# Patient Record
Sex: Female | Born: 1970
Health system: Southern US, Community
[De-identification: ages and names within clinical notes are randomized; demographics above are authoritative.]

## PROBLEM LIST (undated history)

## (undated) DIAGNOSIS — H905 Unspecified sensorineural hearing loss: Secondary | ICD-10-CM

## (undated) DIAGNOSIS — N951 Menopausal and female climacteric states: Secondary | ICD-10-CM

## (undated) DIAGNOSIS — E782 Mixed hyperlipidemia: Secondary | ICD-10-CM

## (undated) DIAGNOSIS — I10 Essential (primary) hypertension: Secondary | ICD-10-CM

## (undated) DIAGNOSIS — I509 Heart failure, unspecified: Secondary | ICD-10-CM

## (undated) DIAGNOSIS — I2510 Atherosclerotic heart disease of native coronary artery without angina pectoris: Secondary | ICD-10-CM

## (undated) DIAGNOSIS — Z8673 Personal history of transient ischemic attack (TIA), and cerebral infarction without residual deficits: Secondary | ICD-10-CM

## (undated) DIAGNOSIS — I429 Cardiomyopathy, unspecified: Secondary | ICD-10-CM

## (undated) DIAGNOSIS — I639 Cerebral infarction, unspecified: Secondary | ICD-10-CM

## (undated) HISTORY — DX: Menopausal and female climacteric states: N95.1

## (undated) HISTORY — DX: Heart failure, unspecified: I50.9

## (undated) HISTORY — DX: Unspecified sensorineural hearing loss: H90.5

## (undated) HISTORY — DX: Atherosclerotic heart disease of native coronary artery without angina pectoris: I25.10

## (undated) HISTORY — PX: TUBAL LIGATION: SHX77

## (undated) HISTORY — PX: ATRIAL CARDIAC PACEMAKER INSERTION: SHX561

## (undated) HISTORY — DX: Personal history of transient ischemic attack (TIA), and cerebral infarction without residual deficits: Z86.73

## (undated) HISTORY — DX: Cardiomyopathy, unspecified: I42.9

## (undated) HISTORY — DX: Mixed hyperlipidemia: E78.2

## (undated) HISTORY — DX: Essential (primary) hypertension: I10

---

## 2017-09-20 DIAGNOSIS — Z8673 Personal history of transient ischemic attack (TIA), and cerebral infarction without residual deficits: Secondary | ICD-10-CM | POA: Insufficient documentation

## 2017-09-20 DIAGNOSIS — I10 Essential (primary) hypertension: Secondary | ICD-10-CM | POA: Diagnosis present

## 2018-01-05 ENCOUNTER — Encounter (HOSPITAL_COMMUNITY): Payer: Self-pay | Admitting: Emergency Medicine

## 2018-01-05 ENCOUNTER — Emergency Department (HOSPITAL_COMMUNITY)
Admission: EM | Admit: 2018-01-05 | Discharge: 2018-01-05 | Disposition: A | Discharge status: Home / Self Care | Payer: Medicare Other | Attending: Emergency Medicine | Admitting: Emergency Medicine

## 2018-01-05 ENCOUNTER — Other Ambulatory Visit: Payer: Self-pay

## 2018-01-05 DIAGNOSIS — I10 Essential (primary) hypertension: Secondary | ICD-10-CM | POA: Insufficient documentation

## 2018-01-05 DIAGNOSIS — F17210 Nicotine dependence, cigarettes, uncomplicated: Secondary | ICD-10-CM | POA: Insufficient documentation

## 2018-01-05 DIAGNOSIS — M79602 Pain in left arm: Secondary | ICD-10-CM | POA: Diagnosis present

## 2018-01-05 HISTORY — DX: Cerebral infarction, unspecified: I63.9

## 2018-01-05 LAB — I-STAT TROPONIN, ED: TROPONIN I, POC: 0 ng/mL (ref 0.00–0.08)

## 2018-01-05 LAB — COMPREHENSIVE METABOLIC PANEL
ALK PHOS: 56 U/L (ref 38–126)
ALT: 12 U/L (ref 0–44)
AST: 21 U/L (ref 15–41)
Albumin: 3.7 g/dL (ref 3.5–5.0)
Anion gap: 9 (ref 5–15)
BILIRUBIN TOTAL: 0.5 mg/dL (ref 0.3–1.2)
BUN: 13 mg/dL (ref 6–20)
CALCIUM: 9.2 mg/dL (ref 8.9–10.3)
CO2: 25 mmol/L (ref 22–32)
CREATININE: 0.9 mg/dL (ref 0.44–1.00)
Chloride: 106 mmol/L (ref 98–111)
Glucose, Bld: 108 mg/dL — ABNORMAL HIGH (ref 70–99)
Potassium: 4.7 mmol/L (ref 3.5–5.1)
Sodium: 140 mmol/L (ref 135–145)
Total Protein: 6.9 g/dL (ref 6.5–8.1)

## 2018-01-05 LAB — CBC WITH DIFFERENTIAL/PLATELET
BASOS PCT: 4 %
Basophils Absolute: 0.7 10*3/uL — ABNORMAL HIGH (ref 0.0–0.1)
Eosinophils Absolute: 0.4 10*3/uL (ref 0.0–0.7)
Eosinophils Relative: 2 %
HCT: 40.9 % (ref 36.0–46.0)
HEMOGLOBIN: 12.1 g/dL (ref 12.0–15.0)
LYMPHS PCT: 19 %
Lymphs Abs: 3.3 10*3/uL (ref 0.7–4.0)
MCH: 23.6 pg — ABNORMAL LOW (ref 26.0–34.0)
MCHC: 29.6 g/dL — ABNORMAL LOW (ref 30.0–36.0)
MCV: 79.7 fL (ref 78.0–100.0)
MONOS PCT: 4 %
Monocytes Absolute: 0.7 10*3/uL (ref 0.1–1.0)
NEUTROS ABS: 12.5 10*3/uL — AB (ref 1.7–7.7)
Neutrophils Relative %: 71 %
Platelets: 356 10*3/uL (ref 150–400)
RBC: 5.13 MIL/uL — ABNORMAL HIGH (ref 3.87–5.11)
RDW: 23.3 % — ABNORMAL HIGH (ref 11.5–15.5)
WBC: 17.6 10*3/uL — ABNORMAL HIGH (ref 4.0–10.5)

## 2018-01-05 MED ORDER — PREDNISONE 20 MG PO TABS
60.0000 mg | ORAL_TABLET | Freq: Once | ORAL | Status: AC
  Administered 2018-01-05: 60 mg via ORAL
  Filled 2018-01-05: qty 3

## 2018-01-05 MED ORDER — HYDROCODONE-ACETAMINOPHEN 5-325 MG PO TABS
1.0000 | ORAL_TABLET | Freq: Once | ORAL | Status: AC
  Administered 2018-01-05: 1 via ORAL
  Filled 2018-01-05: qty 1

## 2018-01-05 MED ORDER — HYDROCODONE-ACETAMINOPHEN 5-325 MG PO TABS: 1.0000 | ORAL_TABLET | ORAL | 0 refills | Status: DC | PRN

## 2018-01-05 MED ORDER — PREDNISONE 20 MG PO TABS: 20.0000 mg | ORAL_TABLET | Freq: Two times a day (BID) | ORAL | 0 refills | Status: DC

## 2018-01-05 NOTE — Discharge Instructions (Signed)
There were no serious problems found to explain your discomfort.  Use heat on the sore area 3 or 4 times a day.  Wear the sling as needed for comfort.  Follow-up with your primary care doctor for checkup in 1 week if not better.  Take your blood pressure medicine as prescribed.

## 2018-01-05 NOTE — ED Provider Notes (Signed)
Patient placed in Quick Look pathway, seen and evaluated   Chief Complaint: left arm pain  HPI:   Charlotte Wyatt is a 47 y.o. female with hx of HTN, CVA 2013 who presents to the ED with left arm pain and tingling. Patient noted the symptoms when she woke up after sleeping on the floor and laying on the left arm. Patient denies chest pain, shortness of breath or n/v.   ROS: M/S: left arm pain  Physical Exam:  BP (!) 207/116   Pulse 78   Temp 98.4 F (36.9 C)   Resp 18   SpO2 100%    Gen: No distress  Neuro: Awake and Alert  Skin: Warm and dry  M/S: full passive range of motion of the left arm with minimal pain. Radial pulse 2+,     Initiation of care has begun. The patient has been counseled on the process, plan, and necessity for staying for the completion/evaluation, and the remainder of the medical screening examination    Ashley Murrain, NP 01/05/18 1741    Hayden Rasmussen, MD 01/06/18 [phone]

## 2018-01-05 NOTE — ED Provider Notes (Signed)
[facility] EMERGENCY DEPARTMENT Provider Note   CSN: [id number] Arrival date & time: 01/05/18  1724     History   Chief Complaint Chief Complaint  Patient presents with  . Arm Pain    HPI Charlotte Wyatt is a 47 y.o. female.  HPI   She presents for evaluation of left arm pain with a tingling sensation.  She noticed it tonight after sleeping on the floor and lying on her left arm.  Prior similar problems.  She denies headache, neck pain, chest pain or back pain.  She did not take her evening carvedilol yet.  There are no other known modifying factors.  Past Medical History:  Diagnosis Date  . Hypertension   . Stroke [facility])     There are no active problems to display for this patient.   History reviewed. No pertinent surgical history.   OB History   None      Home Medications    Prior to Admission medications   Not on File    Family History No family history on file.  Social History Social History   Tobacco Use  . Smoking status: Current Every Day Smoker    Packs/day: 0.50  . Smokeless tobacco: Never Used  Substance Use Topics  . Alcohol use: Yes    Comment: occasionally  . Drug use: Never     Allergies   Patient has no known allergies.   Review of Systems Review of Systems  All other systems reviewed and are negative.    Physical Exam Updated Vital Signs BP (!) 225/130 (BP Location: Right Arm)   Pulse 78   Temp 99 F (37.2 C) (Oral)   Resp 16   Ht 5\' 2"  (1.575 m)   Wt 86.2 kg   SpO2 99%   BMI 34.75 kg/m   Physical Exam  Constitutional: She is oriented to person, place, and time. She appears well-developed and well-nourished.  HENT:  Head: Normocephalic and atraumatic.  Eyes: Pupils are equal, round, and reactive to light. Conjunctivae and EOM are normal.  Neck: Normal range of motion and phonation normal. Neck supple.  Cardiovascular: Normal rate and regular rhythm.  Pulmonary/Chest: Effort normal and breath  sounds normal. She exhibits no tenderness.  Musculoskeletal: Normal range of motion.  Normal range of motion arms bilaterally.  No deformity of the left arm.  Neurological: She is alert and oriented to person, place, and time. She exhibits normal muscle tone.  Skin: Skin is warm and dry.  Psychiatric: She has a normal mood and affect. Her behavior is normal. Judgment and thought content normal.  Nursing note and vitals reviewed.    ED Treatments / Results  Labs (all labs ordered are listed, but only abnormal results are displayed) Labs Reviewed  CBC WITH DIFFERENTIAL/PLATELET - Abnormal; Notable for the following components:      Result Value   WBC 17.6 (*)    RBC 5.13 (*)    MCH 23.6 (*)    MCHC 29.6 (*)    RDW 23.3 (*)    Neutro Abs 12.5 (*)    Basophils Absolute 0.7 (*)    All other components within normal limits  COMPREHENSIVE METABOLIC PANEL - Abnormal; Notable for the following components:   Glucose, Bld 108 (*)    All other components within normal limits  I-STAT TROPONIN, ED    EKG EKG Interpretation  Date/Time:  Thursday January 05 2018 17:46:30 EDT Ventricular Rate:  75 PR Interval:  162 QRS Duration:  90 QT Interval:  414 QTC Calculation: 462 R Axis:   -19 Text Interpretation:  Normal sinus rhythm ST & T wave abnormality, consider inferolateral ischemia Prolonged QT Abnormal ECG No previous ECGs available Confirmed by Daleen Bo [phone]) on 01/05/2018 10:26:16 PM   Radiology No results found.  Procedures Procedures (including critical care time)  Medications Ordered in ED Medications - No data to display   Initial Impression / Assessment and Plan / ED Course  I have reviewed the triage vital signs and the nursing notes.  Pertinent labs & imaging results that were available during my care of the patient were reviewed by me and considered in my medical decision making (see chart for details).  Clinical Course as of Jan 06 2228  Thu Jan 05, 2018    2226 Comment 3:        [EW]  2226 Comment 3:        [EW]  2226 I-Stat Troponin, ED (not at [facility]) [EW]  2227 Normal  I-Stat Troponin, ED (not at [facility]) [EW]  2227 Normal except white count high, and abnormal red cell indices.  CBC with Differential(!) [EW]  2228 Normal except glucose high  Comprehensive metabolic panel(!) [EW]  7482 Abnormal, high  BP(!): 225/130 [EW]    Clinical Course User Index [EW] Daleen Bo, MD     Patient Vitals for the past 24 hrs:  BP Temp Temp src Pulse Resp SpO2 Height Weight  01/05/18 2314 (!) 211/121 98.5 F (36.9 C) Oral 79 16 97 % - -  01/05/18 2200 (!) 191/113 - - 82 16 99 % - -  01/05/18 2124 - - - - - - 5\' 2"  (1.575 m) 86.2 kg  01/05/18 1955 (!) 225/130 99 F (37.2 C) Oral 78 16 99 % - -  01/05/18 1735 (!) 207/116 98.4 F (36.9 C) - 78 18 100 % - -    At discharge- reevaluation with update and discussion. After initial assessment and treatment, an updated evaluation reveals she is comfortable has no further complaints.Daleen Bo   Medical Decision Making: Reported pain and numbness left arm without significant physical findings.  Doubt CVA, cervical myelopathy or brachial plexopathy.  Also doubt fracture or cellulitis.  No indication for further ED work-up.  CRITICAL CARE-no Performed by: Daleen Bo  Nursing Notes Reviewed/ Care Coordinated Applicable Imaging Reviewed Interpretation of Laboratory Data incorporated into ED treatment  The patient appears reasonably screened and/or stabilized for discharge and I doubt any other medical condition or other [facility] requiring further screening, evaluation, or treatment in the ED at this time prior to discharge.  Plan: Home Medications-continue usual medications; Home Treatments-rest; return here if the recommended treatment, does not improve the symptoms; Recommended follow up-PCP follow-up as needed    Final Clinical Impressions(s) / ED Diagnoses   Final diagnoses:  None    ED  Discharge Orders    None       Daleen Bo, MD 01/06/18 1535

## 2018-01-05 NOTE — ED Triage Notes (Signed)
Pt reports severe sharp L arm pain starting today around 12:30 pm. Pt reports the pain starts in her shoulder and goes down to her hand. Pt's BP 207/116

## 2018-07-14 ENCOUNTER — Emergency Department (HOSPITAL_COMMUNITY): Payer: Medicare Other

## 2018-07-14 ENCOUNTER — Other Ambulatory Visit: Payer: Self-pay

## 2018-07-14 ENCOUNTER — Encounter (HOSPITAL_COMMUNITY): Payer: Self-pay | Admitting: Emergency Medicine

## 2018-07-14 ENCOUNTER — Inpatient Hospital Stay (HOSPITAL_COMMUNITY)
Admission: EM | Admit: 2018-07-14 | Discharge: 2018-07-17 | DRG: 293 | Disposition: A | Discharge status: Home / Self Care | Payer: Medicare Other | Attending: Student in an Organized Health Care Education/Training Program | Admitting: Student in an Organized Health Care Education/Training Program

## 2018-07-14 ENCOUNTER — Inpatient Hospital Stay (HOSPITAL_COMMUNITY): Payer: Medicare Other

## 2018-07-14 DIAGNOSIS — I428 Other cardiomyopathies: Secondary | ICD-10-CM | POA: Diagnosis not present

## 2018-07-14 DIAGNOSIS — K029 Dental caries, unspecified: Secondary | ICD-10-CM | POA: Diagnosis present

## 2018-07-14 DIAGNOSIS — R7989 Other specified abnormal findings of blood chemistry: Secondary | ICD-10-CM

## 2018-07-14 DIAGNOSIS — I509 Heart failure, unspecified: Secondary | ICD-10-CM | POA: Diagnosis present

## 2018-07-14 DIAGNOSIS — Z8673 Personal history of transient ischemic attack (TIA), and cerebral infarction without residual deficits: Secondary | ICD-10-CM | POA: Diagnosis not present

## 2018-07-14 DIAGNOSIS — R05 Cough: Secondary | ICD-10-CM | POA: Diagnosis not present

## 2018-07-14 DIAGNOSIS — I420 Dilated cardiomyopathy: Secondary | ICD-10-CM | POA: Diagnosis present

## 2018-07-14 DIAGNOSIS — I110 Hypertensive heart disease with heart failure: Principal | ICD-10-CM | POA: Diagnosis present

## 2018-07-14 DIAGNOSIS — Z86718 Personal history of other venous thrombosis and embolism: Secondary | ICD-10-CM

## 2018-07-14 DIAGNOSIS — D72829 Elevated white blood cell count, unspecified: Secondary | ICD-10-CM | POA: Diagnosis present

## 2018-07-14 DIAGNOSIS — I340 Nonrheumatic mitral (valve) insufficiency: Secondary | ICD-10-CM | POA: Diagnosis not present

## 2018-07-14 DIAGNOSIS — I10 Essential (primary) hypertension: Secondary | ICD-10-CM | POA: Diagnosis present

## 2018-07-14 DIAGNOSIS — S025XXA Fracture of tooth (traumatic), initial encounter for closed fracture: Secondary | ICD-10-CM | POA: Diagnosis not present

## 2018-07-14 DIAGNOSIS — Z79899 Other long term (current) drug therapy: Secondary | ICD-10-CM | POA: Diagnosis not present

## 2018-07-14 DIAGNOSIS — I2510 Atherosclerotic heart disease of native coronary artery without angina pectoris: Secondary | ICD-10-CM | POA: Insufficient documentation

## 2018-07-14 DIAGNOSIS — Z7901 Long term (current) use of anticoagulants: Secondary | ICD-10-CM

## 2018-07-14 DIAGNOSIS — X58XXXA Exposure to other specified factors, initial encounter: Secondary | ICD-10-CM

## 2018-07-14 DIAGNOSIS — K0389 Other specified diseases of hard tissues of teeth: Secondary | ICD-10-CM | POA: Diagnosis present

## 2018-07-14 DIAGNOSIS — I361 Nonrheumatic tricuspid (valve) insufficiency: Secondary | ICD-10-CM | POA: Diagnosis not present

## 2018-07-14 DIAGNOSIS — K047 Periapical abscess without sinus: Secondary | ICD-10-CM | POA: Diagnosis present

## 2018-07-14 DIAGNOSIS — Z23 Encounter for immunization: Secondary | ICD-10-CM | POA: Diagnosis present

## 2018-07-14 DIAGNOSIS — Z7952 Long term (current) use of systemic steroids: Secondary | ICD-10-CM

## 2018-07-14 DIAGNOSIS — I5023 Acute on chronic systolic (congestive) heart failure: Secondary | ICD-10-CM | POA: Diagnosis present

## 2018-07-14 DIAGNOSIS — F1011 Alcohol abuse, in remission: Secondary | ICD-10-CM

## 2018-07-14 DIAGNOSIS — D72824 Basophilia: Secondary | ICD-10-CM | POA: Diagnosis not present

## 2018-07-14 DIAGNOSIS — F17210 Nicotine dependence, cigarettes, uncomplicated: Secondary | ICD-10-CM | POA: Diagnosis present

## 2018-07-14 DIAGNOSIS — H9190 Unspecified hearing loss, unspecified ear: Secondary | ICD-10-CM | POA: Diagnosis present

## 2018-07-14 DIAGNOSIS — M272 Inflammatory conditions of jaws: Secondary | ICD-10-CM | POA: Diagnosis present

## 2018-07-14 LAB — COMPREHENSIVE METABOLIC PANEL
ALBUMIN: 3.2 g/dL — AB (ref 3.5–5.0)
ALT: 17 U/L (ref 0–44)
AST: 25 U/L (ref 15–41)
Alkaline Phosphatase: 60 U/L (ref 38–126)
Anion gap: 8 (ref 5–15)
BUN: 14 mg/dL (ref 6–20)
CHLORIDE: 107 mmol/L (ref 98–111)
CO2: 23 mmol/L (ref 22–32)
Calcium: 8.7 mg/dL — ABNORMAL LOW (ref 8.9–10.3)
Creatinine, Ser: 1.03 mg/dL — ABNORMAL HIGH (ref 0.44–1.00)
GFR calc Af Amer: >60 mL/min (ref >60)
GFR calc non Af Amer: >60 mL/min (ref >60)
GLUCOSE: 111 mg/dL — AB (ref 70–99)
POTASSIUM: 4.1 mmol/L (ref 3.5–5.1)
SODIUM: 138 mmol/L (ref 135–145)
Total Bilirubin: 0.7 mg/dL (ref 0.3–1.2)
Total Protein: 6.4 g/dL — ABNORMAL LOW (ref 6.5–8.1)

## 2018-07-14 LAB — RESPIRATORY PANEL BY PCR
Adenovirus: NOT DETECTED
Bordetella pertussis: NOT DETECTED
Chlamydophila pneumoniae: NOT DETECTED
Coronavirus 229E: NOT DETECTED
Coronavirus HKU1: NOT DETECTED
Coronavirus NL63: NOT DETECTED
Coronavirus OC43: NOT DETECTED
Influenza A: NOT DETECTED
Influenza B: NOT DETECTED
METAPNEUMOVIRUS-RVPPCR: NOT DETECTED
Mycoplasma pneumoniae: NOT DETECTED
PARAINFLUENZA VIRUS 1-RVPPCR: NOT DETECTED
Parainfluenza Virus 2: NOT DETECTED
Parainfluenza Virus 3: NOT DETECTED
Parainfluenza Virus 4: NOT DETECTED
RHINOVIRUS / ENTEROVIRUS - RVPPCR: NOT DETECTED
Respiratory Syncytial Virus: NOT DETECTED

## 2018-07-14 LAB — CBC WITH DIFFERENTIAL/PLATELET
Basophils Absolute: 1.4 10*3/uL — ABNORMAL HIGH (ref 0.0–0.1)
Basophils Relative: 4 %
EOS ABS: 0.5 10*3/uL (ref 0.0–0.5)
EOS PCT: 1 %
HEMATOCRIT: 37.1 % (ref 36.0–46.0)
HEMOGLOBIN: 10.8 g/dL — AB (ref 12.0–15.0)
LYMPHS ABS: 3.9 10*3/uL (ref 0.7–4.0)
LYMPHS PCT: 12 %
MCH: 22.5 pg — ABNORMAL LOW (ref 26.0–34.0)
MCHC: 29.1 g/dL — AB (ref 30.0–36.0)
MCV: 77.1 fL — ABNORMAL LOW (ref 80.0–100.0)
MONO ABS: 1.6 10*3/uL — AB (ref 0.1–1.0)
Monocytes Relative: 5 %
NEUTROS PCT: 78 %
NRBC: 0.4 % — AB (ref 0.0–0.2)
Neutro Abs: 26.2 10*3/uL — ABNORMAL HIGH (ref 1.7–7.7)
Platelets: 375 10*3/uL (ref 150–400)
RBC: 4.81 MIL/uL (ref 3.87–5.11)
RDW: 23.2 % — AB (ref 11.5–15.5)
WBC: 33 10*3/uL — ABNORMAL HIGH (ref 4.0–10.5)

## 2018-07-14 LAB — ETHANOL: Alcohol, Ethyl (B): <10 mg/dL (ref <10)

## 2018-07-14 LAB — ECHOCARDIOGRAM COMPLETE
Height: 62 in
Weight: 3136 oz

## 2018-07-14 LAB — TROPONIN I: Troponin I: <0.03 ng/mL (ref <0.03)

## 2018-07-14 LAB — PROTIME-INR
INR: 3.1 — ABNORMAL HIGH (ref 0.8–1.2)
PROTHROMBIN TIME: 31.6 s — AB (ref 11.4–15.2)

## 2018-07-14 LAB — SAVE SMEAR(SSMR), FOR PROVIDER SLIDE REVIEW

## 2018-07-14 LAB — BRAIN NATRIURETIC PEPTIDE: B Natriuretic Peptide: 2792.3 pg/mL — ABNORMAL HIGH (ref 0.0–100.0)

## 2018-07-14 LAB — TSH: TSH: 1.085 u[IU]/mL (ref 0.350–4.500)

## 2018-07-14 MED ORDER — AMOXICILLIN-POT CLAVULANATE 875-125 MG PO TABS
1.0000 | ORAL_TABLET | Freq: Two times a day (BID) | ORAL | Status: DC
  Administered 2018-07-14 – 2018-07-17 (×6): 1 via ORAL
  Filled 2018-07-14 (×7): qty 1

## 2018-07-14 MED ORDER — ACETAMINOPHEN 325 MG PO TABS
650.0000 mg | ORAL_TABLET | Freq: Four times a day (QID) | ORAL | Status: DC | PRN
  Administered 2018-07-15 – 2018-07-16 (×4): 650 mg via ORAL
  Filled 2018-07-14 (×5): qty 2

## 2018-07-14 MED ORDER — WARFARIN - PHARMACIST DOSING INPATIENT
Freq: Every day | Status: DC
  Administered 2018-07-16: 18:00:00

## 2018-07-14 MED ORDER — SODIUM CHLORIDE 0.9% FLUSH
3.0000 mL | Freq: Two times a day (BID) | INTRAVENOUS | Status: DC
  Administered 2018-07-14 – 2018-07-17 (×6): 3 mL via INTRAVENOUS

## 2018-07-14 MED ORDER — CARVEDILOL 25 MG PO TABS
25.0000 mg | ORAL_TABLET | Freq: Two times a day (BID) | ORAL | Status: DC
  Administered 2018-07-14 – 2018-07-17 (×6): 25 mg via ORAL
  Filled 2018-07-14 (×6): qty 1

## 2018-07-14 MED ORDER — HYDRALAZINE HCL 25 MG PO TABS
25.0000 mg | ORAL_TABLET | Freq: Three times a day (TID) | ORAL | Status: DC
  Administered 2018-07-14 – 2018-07-17 (×10): 25 mg via ORAL
  Filled 2018-07-14 (×10): qty 1

## 2018-07-14 MED ORDER — POLYETHYLENE GLYCOL 3350 17 G PO PACK: 17.0000 g | PACK | Freq: Every day | ORAL | Status: DC | PRN

## 2018-07-14 MED ORDER — ACETAMINOPHEN 650 MG RE SUPP: 650.0000 mg | Freq: Four times a day (QID) | RECTAL | Status: DC | PRN

## 2018-07-14 MED ORDER — FUROSEMIDE 10 MG/ML IJ SOLN
40.0000 mg | Freq: Once | INTRAMUSCULAR | Status: AC
  Administered 2018-07-14: 40 mg via INTRAVENOUS
  Filled 2018-07-14: qty 4

## 2018-07-14 MED ORDER — IPRATROPIUM-ALBUTEROL 0.5-2.5 (3) MG/3ML IN SOLN
3.0000 mL | Freq: Once | RESPIRATORY_TRACT | Status: AC
  Administered 2018-07-14: 3 mL via RESPIRATORY_TRACT
  Filled 2018-07-14: qty 3

## 2018-07-14 MED ORDER — FUROSEMIDE 10 MG/ML IJ SOLN
40.0000 mg | Freq: Every day | INTRAMUSCULAR | Status: DC
  Filled 2018-07-14: qty 4

## 2018-07-14 MED ORDER — LISINOPRIL 40 MG PO TABS
40.0000 mg | ORAL_TABLET | Freq: Every day | ORAL | Status: DC
  Administered 2018-07-14 – 2018-07-17 (×4): 40 mg via ORAL
  Filled 2018-07-14 (×2): qty 1
  Filled 2018-07-14: qty 2
  Filled 2018-07-14: qty 1

## 2018-07-14 MED ORDER — WARFARIN SODIUM 7.5 MG PO TABS
7.5000 mg | ORAL_TABLET | Freq: Once | ORAL | Status: AC
  Administered 2018-07-14: 7.5 mg via ORAL
  Filled 2018-07-14 (×2): qty 1

## 2018-07-14 NOTE — Progress Notes (Signed)
[place] for warfarin Indication: CVA   Assessment: 85 yof on warfarin PTA for CVA presenting with SOB. Pharmacy consulted to dose warfarin inpatient. INR slightly supratherapeutic at 3.1 on admit. Hg 10.8, plt wnl. No active bleed issues documented. No interacting medications noted.  PTA warfarin dose: 7.5mg  daily (last dose 3/5 PTA)  Goal of Therapy:  INR 2-3 Monitor platelets by anticoagulation protocol: Yes   Plan:  Warfarin 7.5mg  PO x 1 dose - continue home dose for now with INR only slightly subtherapeutic, may need to reduce if trends up further Daily INR Monitor CBC, s/sx bleeding  Elicia Lamp, PharmD, BCPS Clinical Pharmacist 07/14/2018 1:22 PM

## 2018-07-14 NOTE — ED Provider Notes (Signed)
[facility] EMERGENCY DEPARTMENT Provider Note   CSN: [id number] Arrival date & time: 07/14/18  0757    History   Chief Complaint Chief Complaint  Patient presents with  . Shortness of Breath    HPI Charlotte Wyatt is a 48 y.o. female.    Level 5 caveat due to difficulty hearing. HPI Patient presents with shortness of breath.  States she has had for the last week or 2.  States whenever she gets swelling on her legs she gets more coughing and brings up sputum.  States she has more swelling on her legs.  States she is taken some Lasix without improvement.  No sick contacts.  Denies COPD or asthma history.  Reportedly is on Coumadin also.  She is somewhat difficult to get history from.  She denies sick contacts. Past Medical History:  Diagnosis Date  . Hypertension   . Stroke [facility])     There are no active problems to display for this patient.   History reviewed. No pertinent surgical history.   OB History   No obstetric history on file.      Home Medications    Prior to Admission medications   Medication Sig Start Date End Date Taking? Authorizing Provider  HYDROcodone-acetaminophen (NORCO) 5-325 MG tablet Take 1 tablet by mouth every 4 (four) hours as needed for moderate pain. 01/05/18   Daleen Bo, MD  predniSONE (DELTASONE) 20 MG tablet Take 1 tablet (20 mg total) by mouth 2 (two) times daily. 01/05/18   Daleen Bo, MD    Family History History reviewed. No pertinent family history.  Social History Social History   Tobacco Use  . Smoking status: Current Every Day Smoker    Packs/day: 0.25  . Smokeless tobacco: Never Used  Substance Use Topics  . Alcohol use: Yes    Comment: occasionally  . Drug use: Never     Allergies   Patient has no known allergies.   Review of Systems Review of Systems  Constitutional: Negative for appetite change.  HENT: Negative for congestion.   Respiratory: Positive for cough and shortness of  breath.   Cardiovascular: Positive for chest pain and leg swelling.  Gastrointestinal: Negative for abdominal pain.  Genitourinary: Negative for flank pain.  Musculoskeletal: Negative for back pain.  Skin: Negative for rash.  Neurological: Negative for weakness.  Psychiatric/Behavioral: Negative for confusion.     Physical Exam Updated Vital Signs BP (!) 146/70 (BP Location: Right Arm)   Pulse 74   Temp (!) 97.4 F (36.3 C) (Oral)   Resp (!) 25   Ht 5\' 2"  (1.575 m)   Wt 88.9 kg   LMP 02/12/2018   SpO2 96%   BMI 35.85 kg/m   Physical Exam HENT:     Head: Normocephalic.  Neck:     Musculoskeletal: Normal range of motion.  Cardiovascular:     Rate and Rhythm: Normal rate and regular rhythm.  Pulmonary:     Comments: Diffuse harsh breath sounds with some wheezes.  Patient is dyspneic. Chest:     Chest wall: No tenderness.  Abdominal:     Tenderness: There is no abdominal tenderness.  Musculoskeletal:     Right lower leg: Edema present.     Left lower leg: Edema present.     Comments: Pitting edema bilateral lower extremities.  Skin:    General: Skin is warm.     Capillary Refill: Capillary refill takes less than 2 seconds.  Neurological:     General:  No focal deficit present.     Mental Status: She is alert.      ED Treatments / Results  Labs (all labs ordered are listed, but only abnormal results are displayed) Labs Reviewed  COMPREHENSIVE METABOLIC PANEL - Abnormal; Notable for the following components:      Result Value   Glucose, Bld 111 (*)    Creatinine, Ser 1.03 (*)    Calcium 8.7 (*)    Total Protein 6.4 (*)    Albumin 3.2 (*)    All other components within normal limits  BRAIN NATRIURETIC PEPTIDE - Abnormal; Notable for the following components:   B Natriuretic Peptide 2,792.3 (*)    All other components within normal limits  CBC WITH DIFFERENTIAL/PLATELET - Abnormal; Notable for the following components:   WBC 33.0 (*)    Hemoglobin 10.8 (*)     MCV 77.1 (*)    MCH 22.5 (*)    MCHC 29.1 (*)    RDW 23.2 (*)    nRBC 0.4 (*)    All other components within normal limits  PROTIME-INR - Abnormal; Notable for the following components:   Prothrombin Time 31.6 (*)    INR 3.1 (*)    All other components within normal limits  TROPONIN I  ETHANOL    EKG None ED ECG REPORT   Date: 07/14/2018  Rate: 79  Rhythm: normal sinus rhythm  QRS Axis: normal  Intervals: normal  ST/T Wave abnormalities: nonspecific ST/T changes  Conduction Disutrbances:none  Narrative Interpretation:   Old EKG Reviewed: unchanged    Radiology Dg Chest 2 View  Result Date: 07/14/2018 CLINICAL DATA:  Shortness of breath for 2 weeks, central chest pain with deep breathing, vomiting, history hypertension EXAM: CHEST - 2 VIEW COMPARISON:  None FINDINGS: Enlargement of cardiac silhouette with pulmonary vascular congestion. Perihilar infiltrate likely pulmonary edema. No pleural effusion or pneumothorax. Bones unremarkable. IMPRESSION: Enlargement of cardiac silhouette with pulmonary vascular congestion and probable pulmonary edema. Electronically Signed   By: Lavonia Dana M.D.   On: 07/14/2018 09:31    Procedures Procedures (including critical care time)  Medications Ordered in ED Medications  ipratropium-albuterol (DUONEB) 0.5-2.5 (3) MG/3ML nebulizer solution 3 mL (3 mLs Nebulization Given 07/14/18 0840)     Initial Impression / Assessment and Plan / ED Course  I have reviewed the triage vital signs and the nursing notes.  Pertinent labs & imaging results that were available during my care of the patient were reviewed by me and considered in my medical decision making (see chart for details).        Patient presents with shortness of breath and cough.  Has had for the last week.  Has diffuse wheezes and harsh breath sounds.  Patient states she gets like this when she has heart failure and her BNP is elevated and x-ray does show volume overload.  Does  have fluid in her legs.  Does however have a leukocytosis of 33.  No pneumonia on x-ray.  Feels patient will benefit admission to the hospital.  Will need IV diuresis since oral diuresis is reportedly not worked.  Final Clinical Impressions(s) / ED Diagnoses   Final diagnoses:  Congestive heart failure, unspecified HF chronicity, unspecified heart failure type ([place])  Leukocytosis, unspecified type    ED Discharge Orders    None       Davonna Belling, MD 07/14/18 1049

## 2018-07-14 NOTE — ED Triage Notes (Signed)
Per pt pt from home can complains of SOB and productive cough x 1week.  Pt does have a history of heart failure. Pt has audible wheeze NAD noted at this time.

## 2018-07-14 NOTE — ED Notes (Signed)
ED TO INPATIENT HANDOFF REPORT  ED Nurse Name and Phone #: Gerald Stabs [phone]  S Name/Age/Gender Charlotte Wyatt 48 y.o. female Room/Bed: 038C/038C  Code Status   Code Status: Full Code  Home/SNF/Other Home Patient oriented to: self Is this baseline? Yes   Triage Complete: Triage complete  Chief Complaint congestion, fluid retention  Triage Note Per pt pt from home can complains of SOB and productive cough x 1week.  Pt does have a history of heart failure. Pt has audible wheeze NAD noted at this time.    Allergies No Known Allergies  Level of Care/Admitting Diagnosis ED Disposition    ED Disposition Condition Comment   Admit  Hospital Area: [facility] [[id number]]  Level of Care: Cardiac Telemetry [103]  Diagnosis: Acute exacerbation of CHF (congestive heart failure) [facility]) [[id number]]  Admitting Physician: Axel Filler [phone]  Attending Physician: Axel Filler [[id number]]  Estimated length of stay: past midnight tomorrow  Certification:: I certify this patient will need inpatient services for at least 2 midnights  PT Class (Do Not Modify): Inpatient [101]  PT Acc Code (Do Not Modify): Private [1]       B Medical/Surgery History Past Medical History:  Diagnosis Date  . Hypertension   . Stroke [facility])    History reviewed. No pertinent surgical history.   A IV Location/Drains/Wounds Patient Lines/Drains/Airways Status   Active Line/Drains/Airways    Name:   Placement date:   Placement time:   Site:   Days:   Peripheral IV 07/14/18 Right Antecubital   07/14/18    0849    Antecubital   less than 1   External Urinary Catheter   07/14/18    1112    -   less than 1          Intake/Output Last 24 hours No intake or output data in the 24 hours ending 07/14/18 1218  Labs/Imaging Results for orders placed or performed during the hospital encounter of 07/14/18 (from the past 48 hour(s))  Comprehensive metabolic panel     Status:  Abnormal   Collection Time: 07/14/18  8:48 AM  Result Value Ref Range   Sodium 138 135 - 145 mmol/L   Potassium 4.1 3.5 - 5.1 mmol/L   Chloride 107 98 - 111 mmol/L   CO2 23 22 - 32 mmol/L   Glucose, Bld 111 (H) 70 - 99 mg/dL   BUN 14 6 - 20 mg/dL   Creatinine, Ser 1.03 (H) 0.44 - 1.00 mg/dL   Calcium 8.7 (L) 8.9 - 10.3 mg/dL   Total Protein 6.4 (L) 6.5 - 8.1 g/dL   Albumin 3.2 (L) 3.5 - 5.0 g/dL   AST 25 15 - 41 U/L   ALT 17 0 - 44 U/L   Alkaline Phosphatase 60 38 - 126 U/L   Total Bilirubin 0.7 0.3 - 1.2 mg/dL   GFR calc non Af Amer >60 >60 mL/min   GFR calc Af Amer >60 >60 mL/min   Anion gap 8 5 - 15    Comment: Performed at [facility], [address] [place], [place] [address]  Troponin I - Once     Status: None   Collection Time: 07/14/18  8:48 AM  Result Value Ref Range   Troponin I <0.03 <0.03 ng/mL    Comment: Performed at [facility] [address] [place], [place] [address]  Brain natriuretic peptide     Status: Abnormal   Collection Time: 07/14/18  8:48  AM  Result Value Ref Range   B Natriuretic Peptide 2,792.3 (H) 0.0 - 100.0 pg/mL    Comment: Performed at [facility] [address] [place], [place] [address]  CBC with Differential     Status: Abnormal (Preliminary result)   Collection Time: 07/14/18  8:48 AM  Result Value Ref Range   WBC 33.0 (H) 4.0 - 10.5 K/uL   RBC 4.81 3.87 - 5.11 MIL/uL   Hemoglobin 10.8 (L) 12.0 - 15.0 g/dL   HCT 37.1 36.0 - 46.0 %   MCV 77.1 (L) 80.0 - 100.0 fL   MCH 22.5 (L) 26.0 - 34.0 pg   MCHC 29.1 (L) 30.0 - 36.0 g/dL   RDW 23.2 (H) 11.5 - 15.5 %   Platelets 375 150 - 400 K/uL    Comment: REPEATED TO VERIFY PLATELET COUNT CONFIRMED BY SMEAR SPECIMEN CHECKED FOR CLOTS    nRBC 0.4 (H) 0.0 - 0.2 %    Comment: Performed at [facility], [place] [address], [place], [place] [address]   Neutrophils Relative % PENDING %   Neutro Abs PENDING 1.7 - 7.7 K/uL   Band Neutrophils PENDING %   Lymphocytes  Relative PENDING %   Lymphs Abs PENDING 0.7 - 4.0 K/uL   Monocytes Relative PENDING %   Monocytes Absolute PENDING 0.1 - 1.0 K/uL   Eosinophils Relative PENDING %   Eosinophils Absolute PENDING 0.0 - 0.5 K/uL   Basophils Relative PENDING %   Basophils Absolute PENDING 0.0 - 0.1 K/uL   WBC Morphology PENDING    RBC Morphology PENDING    Smear Review PENDING    Other PENDING %   nRBC PENDING 0 /100 WBC   Metamyelocytes Relative PENDING %   Myelocytes PENDING %   Promyelocytes Relative PENDING %   Blasts PENDING %  Protime-INR     Status: Abnormal   Collection Time: 07/14/18  8:48 AM  Result Value Ref Range   Prothrombin Time 31.6 (H) 11.4 - 15.2 seconds   INR 3.1 (H) 0.8 - 1.2    Comment: (NOTE) INR goal varies based on device and disease states. Performed at [facility], [place] [address], [place], [place] [address]   Ethanol     Status: None   Collection Time: 07/14/18  8:48 AM  Result Value Ref Range   Alcohol, Ethyl (B) <10 <10 mg/dL    Comment: (NOTE) Lowest detectable limit for serum alcohol is 10 mg/dL. For medical purposes only. Performed at [facility], [place] [address] [place],  [address]    Dg Chest 2 View  Result Date: 07/14/2018 CLINICAL DATA:  Shortness of breath for 2 weeks, central chest pain with deep breathing, vomiting, history hypertension EXAM: CHEST - 2 VIEW COMPARISON:  None FINDINGS: Enlargement of cardiac silhouette with pulmonary vascular congestion. Perihilar infiltrate likely pulmonary edema. No pleural effusion or pneumothorax. Bones unremarkable. IMPRESSION: Enlargement of cardiac silhouette with pulmonary vascular congestion and probable pulmonary edema. Electronically Signed   By: Lavonia Dana M.D.   On: 07/14/2018 09:31    Pending Labs Unresulted Labs (From admission, onward)    Start     Ordered   07/15/18 2956  Basic metabolic panel  Daily,   R     07/14/18 1209   07/15/18 0500  CBC  Tomorrow morning,   R     07/14/18  1211   07/14/18 1212  Respiratory Panel by PCR  (Respiratory virus panel with precautions)  Once,   R     07/14/18 1211   07/14/18 1208  TSH  Once,   R     07/14/18 1209   07/14/18 1204  HIV antibody (Routine Testing)  Once,   R     07/14/18 1209          Vitals/Pain Today's Vitals   07/14/18 1100 07/14/18 1115 07/14/18 1145 07/14/18 1215  BP: (!) 162/100 (!) 184/159 (!) 165/120 (!) 191/128  Pulse: 61 72 83 82  Resp: (!) 21 (!) 26 (!) 21 15  Temp:      TempSrc:      SpO2: 94% 96% 94% 93%  Weight:      Height:      PainSc:        Isolation Precautions Droplet precaution  Medications Medications  sodium chloride flush (NS) 0.9 % injection 3 mL (has no administration in time range)  acetaminophen (TYLENOL) tablet 650 mg (has no administration in time range)    Or  acetaminophen (TYLENOL) suppository 650 mg (has no administration in time range)  polyethylene glycol (MIRALAX / GLYCOLAX) packet 17 g (has no administration in time range)  furosemide (LASIX) injection 40 mg (has no administration in time range)  lisinopril (PRINIVIL,ZESTRIL) tablet 40 mg (has no administration in time range)  carvedilol (COREG) tablet 25 mg (has no administration in time range)  hydrALAZINE (APRESOLINE) tablet 25 mg (has no administration in time range)  ipratropium-albuterol (DUONEB) 0.5-2.5 (3) MG/3ML nebulizer solution 3 mL (3 mLs Nebulization Given 07/14/18 0840)  furosemide (LASIX) injection 40 mg (40 mg Intravenous Given 07/14/18 1117)    Mobility walks with person assist Low fall risk   Focused Assessments Cardiac Assessment Handoff:    Lab Results  Component Value Date   TROPONINI <0.03 07/14/2018   No results found for: DDIMER Does the Patient currently have chest pain? No     R Recommendations: See Admitting Provider Note  Report given to:   Additional Notes:  Will call Pharmacy to verify meds now.

## 2018-07-14 NOTE — Progress Notes (Signed)
RN received report at 1343. Pt arrived to room at 1412 from ED. Pt oriented to room and equipment. Vitals obtained. Call bell within reach, will continue to monitor.   Fritz Pickerel, RN

## 2018-07-14 NOTE — H&P (Signed)
Date: 07/14/2018               Patient Name:  Charlotte Wyatt MRN: [id number]  DOB: 1970/05/31 Age / Sex: 48 y.o., female   PCP: Patient, No Pcp Per         Medical Service: Internal Medicine Teaching Service         Attending Physician: Dr. Evette Doffing, Mallie Mussel, *    First Contact: Dr. Laural Golden Pager: [phone]  Second Contact: Dr. Philipp Ovens Pager: [phone]       After Hours (After 5p/  First Contact Pager: [phone]  weekends / holidays): Second Contact Pager: [phone]   Chief Complaint: Worsening shortness of breath for 2 weeks  History of Present Illness: Charlotte Wyatt is a 48 y.o. 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  Patient currently visiting her daughter in [place].  Has an history of nonischemic dilated cardiomyopathy per chart review had cardiac catheterization in September 2009, history of LV thrombus and multiple strokes, could not find etiology, most likely embolic as patient is on Coumadin for many years.  Patient is very hard of hearing.  History of alcohol abuse, decreased her alcohol intake after diagnosis of cardiomyopathy, now drinks occasionally, last drink was during holidays.  Has an history of uncontrolled hypertension.  According to patient she is compliant with her medications, except Lasix as she was ran out of that 2 days ago.  Per patient she continued to experience worsening of lower extremity edema despite using her Lasix. She was also complaining of worsening shortness of breath and orthopnea no PND.  She denies any chest pain or palpitations.  No nausea or vomiting.   She was also complaining of worsening cough with clear sputum and some chills.  Denies any fever, nasal congestion, sore throat or sick  contacts.  She states that she normally gets cough whenever she has her heart failure exacerbation. She denies any recent change in her appetite or bowel habits. Denies any urinary symptoms.  ED course.  Hemodynamically stable, afebrile, hypertensive, saturating well on room air, bilateral crackles and wheezing and lower extremity edema.  Labs positive for leukocytosis, BNP at 2792, mildly elevated creatinine at 1.03 and was admitted for CHF exacerbation.  Meds:  Current Meds  Medication Sig  . acetaminophen (TYLENOL) 500 MG tablet Take 1,000 mg by mouth every 6 (six) hours as needed for mild pain.  . carvedilol (COREG) 25 MG tablet Take 25 mg by mouth 2 (two) times daily with a meal.  . hydrALAZINE (APRESOLINE) 25 MG tablet Take 25 mg by mouth 3 (three) times daily.  Marland Kitchen lisinopril (PRINIVIL,ZESTRIL) 40 MG tablet Take 40 mg by mouth daily.  Marland Kitchen warfarin (COUMADIN) 5 MG tablet Take 7.5 mg by mouth daily. Taking 1 &1/2 tablet (7.5mg ) daily    Allergies: Allergies as of 07/14/2018  . (No Known Allergies)   Past Medical History:  Diagnosis Date  . Hypertension   . Stroke [facility])     Family History: No pertinent family history.  Social History: Currently visiting daughter in [place], history of alcohol abuse, currently occasional drinker, smokes half a pack per day, denies any illicit drug use.  Review of Systems: A complete ROS was negative except as per HPI.   Physical Exam: Blood pressure (!) 178/94,  pulse 78, temperature 98 F (36.7 C), temperature source Oral, resp. rate 18, height 5\' 2"  (1.575 m), weight 88.9 kg, last menstrual period 02/12/2018, SpO2 95 %. Vitals:   07/14/18 1215 07/14/18 1230 07/14/18 1346 07/14/18 1425  BP: (!) 191/128 (!) 175/110 (!) 178/94 (!) 171/108  Pulse: 82 77 78 78  Resp: 15 20 18 18   Temp:   98 F (36.7 C) (!) 97.4 F (36.3 C)  TempSrc:   Oral Oral  SpO2: 93%  95% 93%  Weight:      Height:       General: Vital signs reviewed.  Patient is  well-developed and well-nourished, in no acute distress and cooperative with exam.  Head: Normocephalic and atraumatic. Eyes: EOMI, conjunctivae normal, no scleral icterus.  Neck: Supple, trachea midline, normal ROM,  JVD,  Cardiovascular: RRR, no murmurs, gallops, or rubs. Pulmonary/Chest: Bilateral basal crackles. Abdominal: Soft, mildly tender RUQ,non-distended, BS +,  Extremities: 2+ lower extremity edema bilaterally,  pulses symmetric and intact bilaterally. No cyanosis or clubbing. Skin: Warm, dry and intact. No rashes or erythema. Psychiatric: Normal mood and affect. speech and behavior is normal. Cognition and memory are normal.  EKG: personally reviewed my interpretation is sinus rhythm with nonspecific T wave changes.  No ischemic changes.  CXR: personally reviewed my interpretation is cardiomegaly with vascular congestion.  Assessment & Plan by Problem: Charlotte Wyatt is a 48 y.o. 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  Active Problems:   Acute exacerbation of CHF (congestive heart failure) (HCC) Symptoms and clinical exam is concerning for CHF exacerbation.  Per chart review her weight in January 2020 was 84.3 kg, today 88.9 kg.  She has prior echoes at care everywhere, last reported in 2017 but I can see the report of 2013 one which shows ejection fraction of 30 to 35%. She has an history of nonischemic dilated cardiomyopathy, according to patient she was told most likely due to her alcohol abuse, no documentation. She will ran out of her Lasix for 2 days, per patient she was experiencing worsening shortness of breath, orthopnea and lower extremity edema despite being taking her Lasix as directed up till 2 days ago. -Admit to  telemetry. -Diuresing currently with IV Lasix 40 mg daily. -Strict intake and output. -Daily weight. -Echocardiogram. -Daily BMP She might get benefit with torsemide if not diuresing well with oral Lasix.  Cough.  Doubt she is having any upper respiratory infection.  Respiratory viral panel is being checked.  Cough is most likely related to her pulmonary edema as she experienced similar symptoms and cough whenever she had an acute exacerbation.  No other signs of URI.  Leukocytosis.  Per chart review it looks like she is having worsening leukocytosis with increased basophil for about a year now.  Borderline thrombocytosis. discussed with Dr.G and because of the presence of metamyelocytes and promyelocytes along with basophilia he advised to check her Jak 2 mutation and BCR-ABL1 mutation.  Mildly elevated creatinine.  At 1.03, baseline below 1.  Most likely due to CHF exacerbation. -Monitor renal function and avoid nephrotoxic.  Hypertension.  Blood pressure elevated.  She did not took any of her meds since this morning. -Resume home dose of hydralazine 25 mg 3 times daily, lisinopril 40 mg and Coreg 25 mg twice daily. -We  can uptitrate her hydralazine if needed.  History of multiple stroke.  Continuing home Coumadin, INR 3.1. Pharmacy will manage her Coumadin. Consider starting her on statin.  DVT prophylaxis.  Coumadin CODE STATUS.  Full Diet.  Heart healthy  Dispo: Admit patient to Inpatient with expected length of stay greater than 2 midnights.  SignedLorella Nimrod, MD 07/14/2018, 2:23 PM  Pager: [phone]

## 2018-07-14 NOTE — Progress Notes (Signed)
  Echocardiogram 2D Echocardiogram has been performed.  Charlotte Wyatt 07/14/2018, 5:10 PM

## 2018-07-15 LAB — BASIC METABOLIC PANEL
Anion gap: 9 (ref 5–15)
BUN: 16 mg/dL (ref 6–20)
CO2: 23 mmol/L (ref 22–32)
Calcium: 8.7 mg/dL — ABNORMAL LOW (ref 8.9–10.3)
Chloride: 105 mmol/L (ref 98–111)
Creatinine, Ser: 1.03 mg/dL — ABNORMAL HIGH (ref 0.44–1.00)
GFR calc Af Amer: >60 mL/min (ref >60)
GFR calc non Af Amer: >60 mL/min (ref >60)
Glucose, Bld: 103 mg/dL — ABNORMAL HIGH (ref 70–99)
Potassium: 4.7 mmol/L (ref 3.5–5.1)
Sodium: 137 mmol/L (ref 135–145)

## 2018-07-15 LAB — PROTIME-INR
INR: 2.5 — AB (ref 0.8–1.2)
Prothrombin Time: 27 seconds — ABNORMAL HIGH (ref 11.4–15.2)

## 2018-07-15 LAB — CBC
HCT: 37.1 % (ref 36.0–46.0)
Hemoglobin: 10.9 g/dL — ABNORMAL LOW (ref 12.0–15.0)
MCH: 22.4 pg — ABNORMAL LOW (ref 26.0–34.0)
MCHC: 29.4 g/dL — AB (ref 30.0–36.0)
MCV: 76.2 fL — ABNORMAL LOW (ref 80.0–100.0)
Platelets: 341 10*3/uL (ref 150–400)
RBC: 4.87 MIL/uL (ref 3.87–5.11)
RDW: 23.6 % — ABNORMAL HIGH (ref 11.5–15.5)
WBC: 37.6 10*3/uL — ABNORMAL HIGH (ref 4.0–10.5)
nRBC: 0.6 % — ABNORMAL HIGH (ref 0.0–0.2)

## 2018-07-15 LAB — HIV ANTIBODY (ROUTINE TESTING W REFLEX): HIV Screen 4th Generation wRfx: NONREACTIVE

## 2018-07-15 MED ORDER — FUROSEMIDE 10 MG/ML IJ SOLN
60.0000 mg | Freq: Once | INTRAMUSCULAR | Status: AC
  Administered 2018-07-15: 60 mg via INTRAVENOUS

## 2018-07-15 MED ORDER — INFLUENZA VAC SPLIT QUAD 0.5 ML IM SUSY
0.5000 mL | PREFILLED_SYRINGE | INTRAMUSCULAR | Status: AC
  Administered 2018-07-17: 0.5 mL via INTRAMUSCULAR
  Filled 2018-07-15: qty 0.5

## 2018-07-15 MED ORDER — WARFARIN SODIUM 7.5 MG PO TABS
7.5000 mg | ORAL_TABLET | Freq: Once | ORAL | Status: AC
  Administered 2018-07-15: 7.5 mg via ORAL
  Filled 2018-07-15: qty 1

## 2018-07-15 NOTE — Progress Notes (Addendum)
Subjective: Patient was seen and evaluated at bedside this morning and again in an hour. No acute events overnight. She looked mildly anxious and confused when woke up this AM at seen. This improved when I saw her again in an hour. She mentions that she was not able to sleep well last night and was sleepy w. She has shortness of breath some times and gets anxious about what if she can not breath.  She denies chest pain. No complaint about jaw pain.  Objective:  Vital signs in last 24 hours: Vitals:   07/14/18 2042 07/15/18 0107 07/15/18 0320 07/15/18 0540  BP: (!) 150/92 (!) 152/93 (!) 143/83 (!) 147/80  Pulse: 73 75 72 73  Resp: (!) 21 16 18    Temp: 97.9 F (36.6 C) 97.7 F (36.5 C) 97.9 F (36.6 C)   TempSrc: Oral Oral Oral   SpO2: 100% 92% 98%   Weight:   87.4 kg   Height:       Physical Exam  Vital sign Reviewed. Constitutional: Appears well-developed and well-nourished. No acute distress.  HENT:  Head: Normocephalic and atraumatic.  Eyes: Conjunctivae are normal.  Cardiovascular: Normal rate, regular rhythm and normal heart sounds.  Respiratory: Effort normal and breath sounds normal. No respiratory distress. No wheezes.  Trace bilateral fine crackle. GI: Soft. Bowel sounds are normal. No distension. There is no tenderness.  Musculoskeletal: Trace bilateral lower extremity edema. Neurological: Is alert and oriented.  Has some slurred speech which is at her baseline.  Skin: Not diaphoretic. No erythema.  Psychiatric: She looked anxious when woke up, but improved on second visit. normal mood and affect. Behavior is normal. Judgment and thought content normal.    Assessment/Plan:  Principal Problem:   Acute on chronic HFrEF (heart failure with reduced ejection fraction) (HCC) Active Problems:   Essential (primary) hypertension   Odontogenic infection  Ms. 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  Acute on chronic heart failure: Likely right>L History of nonischemic, dilated cardiomyopathy likely due to alcohol abuse per patient report.  Echo yesterday showed some worsening of HF. EF: 20 to 20% comparing to previous EF of 30-35% on echo at 2013. ( Another echocardiogram in 2017 performed but the but the data is not available in chart)   Patient received 40 mg IV Lasix yesterday, 3.5 lbs and I/O net- 737 ml. Volume status improved today.  Still has shortness of breath.   Renal function stable and potassium normal at 4.7.  we will continue diuresing her with 60 mg IV Lasix today.  Also seems to have anxiety component involved with shortness of breath.  May consider low-dose of p.o. Ativan he developed further anxiety. No concern for anxiety due to alcohol withdrawal as she mentions that her last drink was more than a month ago.  -IV Lasix 60 mg IV BID today -Strict I/O's -Daily weights -Daily BMP -May give low-dose of p.o. Ativan for anxiety during the night -Continue lisinopril 40 mg daily -Continue Coreg 25 mg twice daily  Leukocytosis with basophilia: Has been discussed with Dr.G and because of the presence of metamyelocytes and promyelocytes along with basophilia he advised to check her Jak 2 mutation and BCR-ABL1 mutation.  She had left jaw pain with dental caries and LAP.  Ontogenic infection vs bone marrow involvement? Infection could play a role in leukocytosis.  However not expected to have severe leukocytosis with that. She is afebrile, and is started on Augmentin 875 mg BID. Her pain and swelling improved today.    -Follow-up BCR/ABL, CML/ALL PCR -Follow-up JAK 2 -Continue Augmentin 875-125 mg twice daily  Hx of LV thrombus, Hx of multiple (most likely embolic) strokes: She  has been on warfarin at home. Today INR 2.5.  -Continue warfarin per pharmacy -Daily INR   Hypertension: -Continue home carvedilol, hydralazine, lisinopril.   Dispo: Anticipated discharge in approximately 2-3 days Dewayne Hatch, MD 07/15/2018, 8:55 AM Pager: [phone]

## 2018-07-15 NOTE — Progress Notes (Signed)
Patient is ambulatory in her room. She ambulates independently in her room and to BR.

## 2018-07-15 NOTE — Progress Notes (Signed)
ANTICOAGULATION CONSULT NOTE - Follow Up Consult  Pharmacy Consult for warfarin Indication: CVA  No Known Allergies  Patient Measurements: Height: 5\' 2"  (157.5 cm) Weight: 192 lb 10.9 oz (87.4 kg)(scale c) IBW/kg (Calculated) : 50.1   Vital Signs: Temp: 97.9 F (36.6 C) (03/07 0320) Temp Source: Oral (03/07 0320) BP: 147/80 (03/07 0540) Pulse Rate: 73 (03/07 0540)  Labs: Recent Labs    07/14/18 0848 07/15/18 0411  HGB 10.8* 10.9*  HCT 37.1 37.1  PLT 375 341  LABPROT 31.6* 27.0*  INR 3.1* 2.5*  CREATININE 1.03* 1.03*  TROPONINI <0.03  --     Estimated Creatinine Clearance: 68.5 mL/min (A) (by C-G formula based on SCr of 1.03 mg/dL (H)).   Medications:  Scheduled:  . amoxicillin-clavulanate  1 tablet Oral Q12H  . carvedilol  25 mg Oral BID WC  . furosemide  40 mg Intravenous Daily  . hydrALAZINE  25 mg Oral Q8H  . lisinopril  40 mg Oral Daily  . sodium chloride flush  3 mL Intravenous Q12H  . Warfarin - Pharmacist Dosing Inpatient   Does not apply q1800    Assessment: Charlotte Wyatt  on warfarin PTA for CVA presenting with SOB. Pharmacy consulted to dose warfarin inpatient. INR 2.5 this morning. Hg 10.9, plt wnL. No active bleed issues documented. Augmentin started 3/6 may affect INR levels, will monitor and adjust if necessary.   PTA warfarin dose: 7.5mg  daily  Goal of Therapy:  INR 2-3 Monitor platelets by anticoagulation protocol: Yes   Plan:  Warfarin 7.5mg  PO x 1 dose  Daily INR Monitor CBC, s/sx bleeding  Harrietta Guardian, PharmD PGY1 Pharmacy Resident 07/15/2018    8:18 AM Please check AMION for all Charlotte numbers

## 2018-07-16 LAB — CBC
HCT: 34 % — ABNORMAL LOW (ref 36.0–46.0)
Hemoglobin: 10.3 g/dL — ABNORMAL LOW (ref 12.0–15.0)
MCH: 22.9 pg — ABNORMAL LOW (ref 26.0–34.0)
MCHC: 30.3 g/dL (ref 30.0–36.0)
MCV: 75.7 fL — AB (ref 80.0–100.0)
NRBC: 0.3 % — AB (ref 0.0–0.2)
Platelets: 391 10*3/uL (ref 150–400)
RBC: 4.49 MIL/uL (ref 3.87–5.11)
RDW: 22.6 % — AB (ref 11.5–15.5)
WBC: 37.2 10*3/uL — ABNORMAL HIGH (ref 4.0–10.5)

## 2018-07-16 LAB — BASIC METABOLIC PANEL
Anion gap: 8 (ref 5–15)
BUN: 13 mg/dL (ref 6–20)
CO2: 26 mmol/L (ref 22–32)
Calcium: 8.6 mg/dL — ABNORMAL LOW (ref 8.9–10.3)
Chloride: 104 mmol/L (ref 98–111)
Creatinine, Ser: 1.01 mg/dL — ABNORMAL HIGH (ref 0.44–1.00)
GFR calc Af Amer: >60 mL/min (ref >60)
Glucose, Bld: 95 mg/dL (ref 70–99)
Potassium: 4.2 mmol/L (ref 3.5–5.1)
Sodium: 138 mmol/L (ref 135–145)

## 2018-07-16 LAB — PROTIME-INR
INR: 3.3 — ABNORMAL HIGH (ref 0.8–1.2)
Prothrombin Time: 33.3 seconds — ABNORMAL HIGH (ref 11.4–15.2)

## 2018-07-16 MED ORDER — WARFARIN SODIUM 2 MG PO TABS
4.0000 mg | ORAL_TABLET | Freq: Once | ORAL | Status: AC
  Administered 2018-07-16: 4 mg via ORAL
  Filled 2018-07-16: qty 2

## 2018-07-16 MED ORDER — FUROSEMIDE 10 MG/ML IJ SOLN
80.0000 mg | Freq: Two times a day (BID) | INTRAMUSCULAR | Status: DC
  Administered 2018-07-16 – 2018-07-17 (×3): 80 mg via INTRAVENOUS
  Filled 2018-07-16 (×2): qty 8

## 2018-07-16 NOTE — Progress Notes (Signed)
Patient had 5 beat run on telemetry. Patient reported no distress or pain. Paged IM resident, whom said to continue to monitor.

## 2018-07-16 NOTE — Progress Notes (Signed)
ANTICOAGULATION CONSULT NOTE - Follow Up Consult  Pharmacy Consult for warfarin Indication: CVA  No Known Allergies  Patient Measurements: Height: 5\' 2"  (157.5 cm) Weight: 193 lb 9.6 oz (87.8 kg)(scale c) IBW/kg (Calculated) : 50.1   Vital Signs: Temp: 97.7 F (36.5 C) (03/08 0736) Temp Source: Oral (03/08 0736) BP: 148/93 (03/08 0736) Pulse Rate: 66 (03/08 0736)  Labs: Recent Labs    07/14/18 0848 07/15/18 0411 07/16/18 0656  HGB 10.8* 10.9*  --   HCT 37.1 37.1  --   PLT 375 341  --   LABPROT 31.6* 27.0* 33.3*  INR 3.1* 2.5* 3.3*  CREATININE 1.03* 1.03* 1.01*  TROPONINI <0.03  --   --     Estimated Creatinine Clearance: 70.1 mL/min (A) (by C-G formula based on SCr of 1.01 mg/dL (H)).   Medications:  Scheduled:  . amoxicillin-clavulanate  1 tablet Oral Q12H  . carvedilol  25 mg Oral BID WC  . furosemide  80 mg Intravenous BID  . hydrALAZINE  25 mg Oral Q8H  . Influenza vac split quadrivalent PF  0.5 mL Intramuscular Tomorrow-1000  . lisinopril  40 mg Oral Daily  . sodium chloride flush  3 mL Intravenous Q12H  . Warfarin - Pharmacist Dosing Inpatient   Does not apply q1800    Assessment: 19 yoF  on warfarin PTA for CVA presenting with SOB. Pharmacy consulted to dose warfarin inpatient.   INR supratherapeutic at 3.3 this morning. Last Hg 10.9, plt wnL. No active bleeding issues documented. INR could possibly be affected by acute infection/Augmentin, will monitor and adjust if necessary.  PTA warfarin dose: 7.5mg  daily  Goal of Therapy:  INR 2-3 Monitor platelets by anticoagulation protocol: Yes   Plan:  Warfarin 4mg  PO x 1 dose (reduced dose by ~1/2 due to elevated INR) Daily INR Monitor CBC, s/sx bleeding  Harrietta Guardian, PharmD PGY1 Pharmacy Resident 07/16/2018    8:54 AM Please check AMION for all [place] numbers

## 2018-07-16 NOTE — Progress Notes (Signed)
   Subjective: No overnight events. Patient reports she is feeling tired. She states she is still coughing up some sputum. She feels her breathing has improved some since she came home. She still feels like her legs are still swollen.   Objective:  Vital signs in last 24 hours: Vitals:   07/15/18 1954 07/16/18 0032 07/16/18 0502 07/16/18 0736  BP: (!) 156/79 (!) 155/97 (!) 167/105 (!) 148/93  Pulse: 70 75 69 66  Resp: 18 18 18 18   Temp: 98.2 F (36.8 C) 97.7 F (36.5 C) 97.8 F (36.6 C) 97.7 F (36.5 C)  TempSrc: Oral Oral Oral Oral  SpO2: 91% 91% 96% 96%  Weight:   87.8 kg   Height:       Physical Exam Constitutional: Sitting up out of bed eating breakfast, appears comfortable Cardiovascular: RRR, no murmurs, rubs, or gallops.  Pulmonary/Chest: CTAB, no wheezes, rales, or rhonchi.  Abdominal: Soft, non tender, non distended. +BS.  Extremities: Warm and well perfused. Trace to 1+ pitting edema.     Assessment/Plan:  Principal Problem:   Acute on chronic HFrEF (heart failure with reduced ejection fraction) (HCC) Active Problems:   Essential (primary) hypertension   Odontogenic infection  Acute on Chronic Systolic Heart Failure: Moderate response to IV lasix yesterday, only net negative 700 cc. Continues to be SOB with LE edema. Renal function at baseline.  -- Increase lasix 80 mg BID -- Add 1500cc fluid restriction  -- Continue Strict I&Os  -- Tele, daily BMP   Leukocytosis Started on augmentin for possible ontogenic infection. She remains afebrile. Myelodysplastic work up pending.  -- Follow-up BCR/ABL, CML/ALL PCR -- Follow-up JAK 2 -- Continue Augmentin BID (day 3/5)  Hx of LV Thrombus & Multiple embolic CVAs: -- Warfarin per pharmacy   Hypertension: -- Continue home carvedilol, hydralazine, lisinopril.  FEN: Fluid restrict, replete lytes prn, heart healthy diet VTE ppx: Warfarin  Code Status: FULL  Dispo: Anticipated discharge in approximately 1-2 day(s).    Velna Ochs, M.D. - PGY3 Pager: [phone] 07/16/2018, 9:46 AM

## 2018-07-17 LAB — BASIC METABOLIC PANEL
Anion gap: 10 (ref 5–15)
BUN: 18 mg/dL (ref 6–20)
CO2: 30 mmol/L (ref 22–32)
Calcium: 8.6 mg/dL — ABNORMAL LOW (ref 8.9–10.3)
Chloride: 98 mmol/L (ref 98–111)
Creatinine, Ser: 1.1 mg/dL — ABNORMAL HIGH (ref 0.44–1.00)
GFR calc Af Amer: >60 mL/min (ref >60)
GFR calc non Af Amer: 59 mL/min — ABNORMAL LOW (ref >60)
Glucose, Bld: 96 mg/dL (ref 70–99)
Potassium: 4.3 mmol/L (ref 3.5–5.1)
Sodium: 138 mmol/L (ref 135–145)

## 2018-07-17 LAB — PROTIME-INR
INR: 2.7 — ABNORMAL HIGH (ref 0.8–1.2)
PROTHROMBIN TIME: 28.3 s — AB (ref 11.4–15.2)

## 2018-07-17 MED ORDER — SPIRONOLACTONE 25 MG PO TABS: 25.0000 mg | ORAL_TABLET | Freq: Every day | ORAL | 0 refills | Status: DC

## 2018-07-17 MED ORDER — WARFARIN SODIUM 3 MG PO TABS: 6.0000 mg | ORAL_TABLET | Freq: Once | ORAL | Status: DC

## 2018-07-17 MED ORDER — FUROSEMIDE 40 MG PO TABS: 40.0000 mg | ORAL_TABLET | Freq: Two times a day (BID) | ORAL | 1 refills | Status: DC

## 2018-07-17 MED FILL — FUROSEMIDE 40 MG TABLET: 40 | 15 days supply | Qty: 30 | Fill #0 | Status: TO

## 2018-07-17 NOTE — Discharge Instructions (Addendum)
Thank you for allowing [place] taking care of you at [facility] came in with shortness of breath and found to have extra fluid in your lungs. We are glad that you are doing better now after treatment.   I prescribed you two new medications, a fluid pill (Furosemide 40 mg twice daily) and Spironolactone 25 mg once a day. Please start both of these medications tomorrow.  Please make sure to follow up with your primary care and cardiologist within 1-2 weeks for reevaluation and blood test. We also noticed some abnormal blood work during your hospital stay and want you to follow up closely with Hematology/Oncology within one week.   Try to weight yourself daily with same cloths and same scale, and call your doctor if you gain >2 lb daily or>5 lb weekly.

## 2018-07-17 NOTE — Discharge Summary (Signed)
Name: Charlotte Wyatt MRN: [id number] DOB: 08/14/1970 48 y.o. PCP: Patient, No Pcp Per  Date of Admission: 07/14/2018  7:58 AM Date of Discharge: 07/17/2018 Attending Physician: Axel Filler, *  Discharge Diagnosis: 1. Acute on Chronic Systolic Heart Failure 2. Leukocytosis  3. Odontogenic infection 4. HTN  Discharge Medications: Allergies as of 07/17/2018   No Known Allergies     Medication List    STOP taking these medications   HYDROcodone-acetaminophen 5-325 MG tablet Commonly known as:  Norco   predniSONE 20 MG tablet Commonly known as:  DELTASONE     TAKE these medications   acetaminophen 500 MG tablet Commonly known as:  TYLENOL Take 1,000 mg by mouth every 6 (six) hours as needed for mild pain.   carvedilol 25 MG tablet Commonly known as:  COREG Take 25 mg by mouth 2 (two) times daily with a meal.   furosemide 40 MG tablet Commonly known as:  LASIX Take 1 tablet (40 mg total) by mouth 2 (two) times daily. Start taking on:  July 18, 2018   hydrALAZINE 25 MG tablet Commonly known as:  APRESOLINE Take 25 mg by mouth 3 (three) times daily.   lisinopril 40 MG tablet Commonly known as:  PRINIVIL,ZESTRIL Take 40 mg by mouth daily.   spironolactone 25 MG tablet Commonly known as:  ALDACTONE Take 1 tablet (25 mg total) by mouth daily for 30 days.   warfarin 5 MG tablet Commonly known as:  COUMADIN Take 7.5 mg by mouth daily. Taking 1 &1/2 tablet (7.5mg ) daily       Disposition and follow-up:   [name] was discharged from [facility] in Stable condition.  At the hospital follow up visit please address:  1.  Leukocytosis: patient was found to have worsening leukocytosis with increased basophils for about a year. Smear illustrated multiple abnormal cell lines, with presence of metamyelocytes and promyelocytes along with basophilia. She will need close follow up/referral to a hematology/oncologist.   Acute on Chronic  HF: patient was discharged on furosemide 40 mg BID and spironolactone 25 mg daily for her HTN; please assess compliance. She will also need to be referred to a cardiologist.   2.  Labs / imaging needed at time of follow-up: CBC  3.  Pending labs/ test needing follow-up: JAK 2 mutation, BCR-ABL, CML/ALL PCR  Follow-up Appointments: Patient to follow up with her PCP in 1 week. She will need to be referred to a cardiologist and heme/onc in her local area as well for close follow up.   Hospital Course by problem list: 1. Acute on Chronic Systolic Heart Failure- Patient presented with an acute on chronic heart failure exacerbation.  She had worsening shortness of breath and lower extremity edema for 2 weeks.  Per chart review her weight in January 2020 was 84.3 kg, on admission 8.9 kg.  She was treated with IV furosemide and had good urinary output.  Renal function remained stable.  On discharge day her weight was 83 kg.  Echocardiogram showed worsening EF of 20 to 25%, other findings listed below.  She will need to be referred to a cardiologist for close follow-up.  Discharged to continue furosemide 40 mg twice daily.  2. Leukocytosis - Per chart review it looks like she is having worsening leukocytosis with increased basophil for about a year now.  Borderline thrombocytosis.   Discussed with Dr. Beryle Beams, heme/oncologist, and because of the presence of metamyelocytes and promyelocytes along with basophilia he advised to  check her Jak 2 mutation and BCR-ABL1 mutation.  Leukocytosis etiology more likely myeloproliferative cause rather than infectious. Labs are pending however she will need a referral and close follow-up with a local hematologist/oncologist.  3. Odontogenic infection-patient presented with left jaw pain with dental caries and leukocytosis.  She was treated with Augmentin for 4 days.  She had improvement of jaw pain and swelling.  Unlikely that this contributed to the leukocytosis which is  more likely a myeloproliferative cause.  4. HTN-blood pressure is elevated on admission.  Resumed her home dose of hydralazine 25 mg 3 times a day, lisinopril 40 mg and carvedilol 25 mg twice daily.  At discharge she was started on spironolactone 25 mg daily.   Discharge Vitals:   BP 106/66 (BP Location: Left Arm)   Pulse 62   Temp 98 F (36.7 C) (Oral)   Resp 18   Ht 5\' 2"  (1.575 m)   Wt 83 kg Comment: scale c  LMP 02/12/2018   SpO2 91%   BMI 33.45 kg/m   Pertinent Labs, Studies, and Procedures:   CBC Latest Ref Rng & Units 07/16/2018 07/15/2018 07/14/2018  WBC 4.0 - 10.5 K/uL 37.2(H) 37.6(H) 33.0(H)  Hemoglobin 12.0 - 15.0 g/dL 10.3(L) 10.9(L) 10.8(L)  Hematocrit 36.0 - 46.0 % 34.0(L) 37.1 37.1  Platelets 150 - 400 K/uL 391 341 375   CMP Latest Ref Rng & Units 07/17/2018 07/16/2018 07/15/2018  Glucose 70 - 99 mg/dL 96 95 103(H)  BUN 6 - 20 mg/dL 18 13 16   Creatinine 0.44 - 1.00 mg/dL 1.10(H) 1.01(H) 1.03(H)  Sodium 135 - 145 mmol/L 138 138 137  Potassium 3.5 - 5.1 mmol/L 4.3 4.2 4.7  Chloride 98 - 111 mmol/L 98 104 105  CO2 22 - 32 mmol/L 30 26 23   Calcium 8.9 - 10.3 mg/dL 8.6(L) 8.6(L) 8.7(L)  Total Protein 6.5 - 8.1 g/dL - - -  Total Bilirubin 0.3 - 1.2 mg/dL - - -  Alkaline Phos 38 - 126 U/L - - -  AST 15 - 41 U/L - - -  ALT 0 - 44 U/L - - -    Echocardiogram FINDINGS  Left Ventricle: The left ventricle has severely reduced systolic function, with an ejection fraction of 20-25%. The cavity size was moderately dilated. There is mildly increased left ventricular wall thickness. Left ventricular diastolic Doppler  parameters are consistent with restrictive filling Elevated left ventricular end-diastolic pressure Left ventrical global hypokinesis without regional wall motion abnormalities. Right Ventricle: The right ventricle has moderately reduced systolic function. The cavity was moderately enlarged. There is no increase in right ventricular wall thickness. Left Atrium: left  atrial size was moderately dilated Right Atrium: right atrial size was mildly dilated. Right atrial pressure is estimated at 15 mmHg. Interatrial Septum: No atrial level shunt detected by color flow Doppler. Pericardium: A small pericardial effusion is present. The pericardial effusion is posterior to the left ventricle. Mitral Valve: The mitral valve is degenerative in appearance. Moderate thickening of the mitral valve leaflet. Moderate calcification of the mitral valve leaflet. Mitral valve regurgitation is mild by color flow Doppler. Tricuspid Valve: The tricuspid valve is normal in structure. Tricuspid valve regurgitation is moderate by color flow Doppler. Aortic Valve: The aortic valve is tricuspid Mild thickening of the aortic valve Mild calcification of the aortic valve. Aortic valve regurgitation is mild by color flow Doppler. There is no evidence of aortic valve stenosis. Pulmonic Valve: The pulmonic valve was grossly normal. Pulmonic valve regurgitation is mild by  color flow Doppler. Venous: The inferior vena cava is dilated in size with less than 50% respiratory variability.     Dg Chest 2 View  Result Date: 07/14/2018 CLINICAL DATA:  Shortness of breath for 2 weeks, central chest pain with deep breathing, vomiting, history hypertension EXAM: CHEST - 2 VIEW COMPARISON:  None FINDINGS: Enlargement of cardiac silhouette with pulmonary vascular congestion. Perihilar infiltrate likely pulmonary edema. No pleural effusion or pneumothorax. Bones unremarkable. IMPRESSION: Enlargement of cardiac silhouette with pulmonary vascular congestion and probable pulmonary edema. Electronically Signed   By: Lavonia Dana M.D.   On: 07/14/2018 09:31   Discharge Instructions: Thank you for allowing [place] taking care of you at [facility] came in with shortness of breath and found to have extra fluid in your lungs. We are glad that you are doing better now after treatment.   I prescribed you two new  medications. Please take Lasix 40 mg twice a day for your heart failure and Spironolactone 25 mg once a day for your blood pressure. Please start both of these medications tomorrow.  Take all of your other medications as prescribed.  Please make sure to follow up with your primary care doctor and cardiologist within 1-2 weeks for reevaluation and a blood test. We also want you to establish care with a cardiologist as soon as possible.  We also noticed some abnormal blood work during your hospital stay and want you to follow up closely with Hematology/Oncology within one week.   Try to weight yourself daily with same cloths and same scale, and call your doctor if you gain >2 lb daily or>5 lb weekly.    SignedMike Craze, DO 07/17/2018, 2:39 PM

## 2018-07-17 NOTE — Progress Notes (Signed)
   Subjective: No overnight events. She reports doing well this morning. She states she was able to ambulate well with no SOB or dyspnea. She feels the swelling of her jaw has improved. No acute complaints.   Objective:  Vital signs in last 24 hours: Vitals:   07/17/18 0435 07/17/18 0554 07/17/18 0831 07/17/18 1339  BP: (!) 153/87 (!) 142/95 (!) 141/84 106/66  Pulse: 65  70 62  Resp: 18   18  Temp: (!) 97.3 F (36.3 C)   98 F (36.7 C)  TempSrc: Oral   Oral  SpO2: 91%   91%  Weight: 83 kg     Height:       General: comfortably laying in bed, no distress  HENT: No jaw swelling, no tenderness, has dental caries  Pulm: no resp distress, trace left basilar crackles Ext: No LEE  Assessment/Plan:  Principal Problem:   Acute on chronic HFrEF (heart failure with reduced ejection fraction) (HCC) Active Problems:   Essential (primary) hypertension   Odontogenic infection  Acute on Chronic Systolic Heart Failure: Good response to diuretics, net output ~2.5L in the past 24 hours. SOB and LE edema has improved. -- Stable for discharge today, will discharge on 40 mg furosemide BID -- Renal function at baseline   Leukocytosis Myelodysplastic work up pending. Smear reviewed with Dr Beryle Beams and illustrated abnormal cells including abnormal basophils, lymphocytes, megakaryocytes, burr cells, tear drop cells. She will need close follow up with heme/onc.   -- Follow-upBCR/ABL, CML/ALL PCR -- Follow-up JAK2 -- Stop Augmentin today   Dispo: Patient is medically stable for discharge today.   Ashtin Rosner N, DO 07/17/2018, 2:10 PM Pager: [phone]

## 2018-07-17 NOTE — Progress Notes (Signed)
ANTICOAGULATION CONSULT NOTE - Follow Up Consult  Pharmacy Consult for warfarin Indication: CVA  No Known Allergies  Patient Measurements: Height: 5\' 2"  (157.5 cm) Weight: 182 lb 14.4 oz (83 kg)(scale c) IBW/kg (Calculated) : 50.1   Vital Signs: Temp: 97.3 F (36.3 C) (03/09 0435) Temp Source: Oral (03/09 0435) BP: 141/84 (03/09 0831) Pulse Rate: 70 (03/09 0831)  Labs: Recent Labs    07/15/18 0411 07/16/18 0656 07/16/18 0937 07/17/18 0552  HGB 10.9*  --  10.3*  --   HCT 37.1  --  34.0*  --   PLT 341  --  391  --   LABPROT 27.0* 33.3*  --  28.3*  INR 2.5* 3.3*  --  2.7*  CREATININE 1.03* 1.01*  --  1.10*    Estimated Creatinine Clearance: 62.5 mL/min (A) (by C-G formula based on SCr of 1.1 mg/dL (H)).   Medications:  Scheduled:  . amoxicillin-clavulanate  1 tablet Oral Q12H  . carvedilol  25 mg Oral BID WC  . furosemide  80 mg Intravenous BID  . hydrALAZINE  25 mg Oral Q8H  . lisinopril  40 mg Oral Daily  . sodium chloride flush  3 mL Intravenous Q12H  . warfarin  6 mg Oral ONCE-1800  . Warfarin - Pharmacist Dosing Inpatient   Does not apply q1800    Assessment: 68 yoF  on warfarin PTA for CVA presenting with SOB. Pharmacy consulted to dose warfarin inpatient.   INR therapeutic at 2.7 this morning. Last Hg 10.3, plt wnL. No active bleeding issues documented. INR could possibly be affected by acute infection/Augmentin, will monitor and adjust if necessary.  PTA warfarin dose: 7.5mg  daily  Goal of Therapy:  INR 2-3 Monitor platelets by anticoagulation protocol: Yes   Plan:  Warfarin 6mg  PO x 1 dose (reduced dose due to recently elevated INR) Daily INR Monitor CBC, s/sx bleeding  Lavena Bullion, PharmD Candidate 07/17/2018    10:50 AM Please check AMION for all [place] numbers

## 2018-07-18 LAB — PATHOLOGIST SMEAR REVIEW

## 2018-07-20 LAB — BCR-ABL1, CML/ALL, PCR, QUANT
E1A2 Transcript: 0.0157 %
b2a2 transcript: 16.7455 %
b3a2 transcript: 13.1258 %

## 2018-07-20 LAB — JAK2 GENOTYPR

## 2018-07-24 ENCOUNTER — Ambulatory Visit: Payer: Medicare Other

## 2018-08-16 ENCOUNTER — Inpatient Hospital Stay (HOSPITAL_COMMUNITY)
Admission: EM | Admit: 2018-08-16 | Discharge: 2018-08-18 | DRG: 293 | Disposition: A | Discharge status: Home / Self Care | Payer: Medicare Other | Attending: Internal Medicine | Admitting: Internal Medicine

## 2018-08-16 ENCOUNTER — Emergency Department (HOSPITAL_COMMUNITY): Payer: Medicare Other

## 2018-08-16 ENCOUNTER — Encounter (HOSPITAL_COMMUNITY): Payer: Self-pay | Admitting: Emergency Medicine

## 2018-08-16 ENCOUNTER — Other Ambulatory Visit: Payer: Self-pay

## 2018-08-16 DIAGNOSIS — I110 Hypertensive heart disease with heart failure: Secondary | ICD-10-CM | POA: Diagnosis not present

## 2018-08-16 DIAGNOSIS — Z79899 Other long term (current) drug therapy: Secondary | ICD-10-CM

## 2018-08-16 DIAGNOSIS — I2510 Atherosclerotic heart disease of native coronary artery without angina pectoris: Secondary | ICD-10-CM | POA: Diagnosis present

## 2018-08-16 DIAGNOSIS — Z8673 Personal history of transient ischemic attack (TIA), and cerebral infarction without residual deficits: Secondary | ICD-10-CM

## 2018-08-16 DIAGNOSIS — I509 Heart failure, unspecified: Secondary | ICD-10-CM

## 2018-08-16 DIAGNOSIS — D72829 Elevated white blood cell count, unspecified: Secondary | ICD-10-CM | POA: Diagnosis present

## 2018-08-16 DIAGNOSIS — Z87891 Personal history of nicotine dependence: Secondary | ICD-10-CM | POA: Diagnosis not present

## 2018-08-16 DIAGNOSIS — R791 Abnormal coagulation profile: Secondary | ICD-10-CM | POA: Diagnosis present

## 2018-08-16 DIAGNOSIS — Z7901 Long term (current) use of anticoagulants: Secondary | ICD-10-CM | POA: Diagnosis not present

## 2018-08-16 DIAGNOSIS — M7989 Other specified soft tissue disorders: Secondary | ICD-10-CM | POA: Diagnosis present

## 2018-08-16 DIAGNOSIS — R7989 Other specified abnormal findings of blood chemistry: Secondary | ICD-10-CM | POA: Diagnosis present

## 2018-08-16 DIAGNOSIS — Z8249 Family history of ischemic heart disease and other diseases of the circulatory system: Secondary | ICD-10-CM | POA: Diagnosis not present

## 2018-08-16 DIAGNOSIS — Z86718 Personal history of other venous thrombosis and embolism: Secondary | ICD-10-CM | POA: Diagnosis not present

## 2018-08-16 DIAGNOSIS — I5023 Acute on chronic systolic (congestive) heart failure: Secondary | ICD-10-CM | POA: Diagnosis present

## 2018-08-16 DIAGNOSIS — I10 Essential (primary) hypertension: Secondary | ICD-10-CM | POA: Diagnosis present

## 2018-08-16 DIAGNOSIS — I428 Other cardiomyopathies: Secondary | ICD-10-CM

## 2018-08-16 DIAGNOSIS — I513 Intracardiac thrombosis, not elsewhere classified: Secondary | ICD-10-CM

## 2018-08-16 DIAGNOSIS — I240 Acute coronary thrombosis not resulting in myocardial infarction: Secondary | ICD-10-CM | POA: Diagnosis present

## 2018-08-16 DIAGNOSIS — Z9114 Patient's other noncompliance with medication regimen: Secondary | ICD-10-CM | POA: Diagnosis not present

## 2018-08-16 LAB — COMPREHENSIVE METABOLIC PANEL
ALT: 17 U/L (ref 0–44)
AST: 31 U/L (ref 15–41)
Albumin: 3.1 g/dL — ABNORMAL LOW (ref 3.5–5.0)
Alkaline Phosphatase: 64 U/L (ref 38–126)
Anion gap: 12 (ref 5–15)
BUN: 11 mg/dL (ref 6–20)
CO2: 22 mmol/L (ref 22–32)
Calcium: 8.7 mg/dL — ABNORMAL LOW (ref 8.9–10.3)
Chloride: 104 mmol/L (ref 98–111)
Creatinine, Ser: 1.19 mg/dL — ABNORMAL HIGH (ref 0.44–1.00)
GFR calc Af Amer: >60 mL/min (ref >60)
GFR calc non Af Amer: 54 mL/min — ABNORMAL LOW (ref >60)
Glucose, Bld: 123 mg/dL — ABNORMAL HIGH (ref 70–99)
Potassium: 4.2 mmol/L (ref 3.5–5.1)
Sodium: 138 mmol/L (ref 135–145)
Total Bilirubin: 1.2 mg/dL (ref 0.3–1.2)
Total Protein: 6.4 g/dL — ABNORMAL LOW (ref 6.5–8.1)

## 2018-08-16 LAB — BRAIN NATRIURETIC PEPTIDE: B Natriuretic Peptide: 2151.1 pg/mL — ABNORMAL HIGH (ref 0.0–100.0)

## 2018-08-16 LAB — POCT I-STAT EG7
Bicarbonate: 25 mmol/L (ref 20.0–28.0)
Calcium, Ion: 1.22 mmol/L (ref 1.15–1.40)
HCT: 36 % (ref 36.0–46.0)
Hemoglobin: 12.2 g/dL (ref 12.0–15.0)
O2 Saturation: 80 %
Potassium: 3.7 mmol/L (ref 3.5–5.1)
Sodium: 140 mmol/L (ref 135–145)
TCO2: 26 mmol/L (ref 22–32)
pCO2, Ven: 41.5 mmHg — ABNORMAL LOW (ref 44.0–60.0)
pH, Ven: 7.388 (ref 7.250–7.430)
pO2, Ven: 45 mmHg (ref 32.0–45.0)

## 2018-08-16 LAB — CBC
HCT: 33.8 % — ABNORMAL LOW (ref 36.0–46.0)
Hemoglobin: 9.7 g/dL — ABNORMAL LOW (ref 12.0–15.0)
MCH: 21.9 pg — ABNORMAL LOW (ref 26.0–34.0)
MCHC: 28.7 g/dL — ABNORMAL LOW (ref 30.0–36.0)
MCV: 76.5 fL — ABNORMAL LOW (ref 80.0–100.0)
Platelets: 287 10*3/uL (ref 150–400)
RBC: 4.42 MIL/uL (ref 3.87–5.11)
RDW: 23.2 % — ABNORMAL HIGH (ref 11.5–15.5)
WBC: 48.6 10*3/uL — ABNORMAL HIGH (ref 4.0–10.5)
nRBC: 1.1 % — ABNORMAL HIGH (ref 0.0–0.2)

## 2018-08-16 LAB — TROPONIN I
Troponin I: <0.03 ng/mL (ref <0.03)
Troponin I: <0.03 ng/mL (ref <0.03)

## 2018-08-16 LAB — PROTIME-INR
INR: 4.2 (ref 0.8–1.2)
Prothrombin Time: 40 seconds — ABNORMAL HIGH (ref 11.4–15.2)

## 2018-08-16 MED ORDER — LISINOPRIL 40 MG PO TABS
40.0000 mg | ORAL_TABLET | Freq: Every day | ORAL | Status: DC
  Administered 2018-08-16 – 2018-08-18 (×3): 40 mg via ORAL
  Filled 2018-08-16 (×3): qty 1

## 2018-08-16 MED ORDER — FUROSEMIDE 10 MG/ML IJ SOLN
40.0000 mg | Freq: Once | INTRAMUSCULAR | Status: AC
  Administered 2018-08-16: 40 mg via INTRAVENOUS
  Filled 2018-08-16: qty 4

## 2018-08-16 MED ORDER — CARVEDILOL 25 MG PO TABS
25.0000 mg | ORAL_TABLET | Freq: Two times a day (BID) | ORAL | Status: DC
  Administered 2018-08-16 – 2018-08-18 (×4): 25 mg via ORAL
  Filled 2018-08-16 (×4): qty 1

## 2018-08-16 MED ORDER — HYDRALAZINE HCL 25 MG PO TABS
25.0000 mg | ORAL_TABLET | Freq: Three times a day (TID) | ORAL | Status: DC
  Administered 2018-08-16 – 2018-08-18 (×6): 25 mg via ORAL
  Filled 2018-08-16 (×7): qty 1

## 2018-08-16 NOTE — ED Notes (Addendum)
ED TO INPATIENT HANDOFF REPORT  ED Nurse Name and Phone #:  Jenny Reichmann RN  S Name/Age/Gender Charlotte Wyatt 48 y.o. female Room/Bed: 019C/019C  Code Status   Code Status: Prior  Home/SNF/Other Home Patient oriented to: self place time  Is this baseline? Yes   Triage Complete: Triage complete  Chief Complaint swollen legs  Triage Note Pt came in by POV with daughter with bilateral leg swelling that has persisted for approx. A week. Hx of stroke. Alert and oriented   Allergies No Known Allergies  Level of Care/Admitting Diagnosis ED Disposition    ED Disposition Condition Comment   Admit  Hospital Area: [facility] [[id number]]  Level of Care: Telemetry Medical [104]  Diagnosis: Acute exacerbation of CHF (congestive heart failure) ([place]) [831517]  Admitting Physician: Bosie Helper  Attending Physician: Lucious Groves [[id number]]  Estimated length of stay: 5 - 7 days  Certification:: I certify this patient will need inpatient services for at least 2 midnights  PT Class (Do Not Modify): Inpatient [101]  PT Acc Code (Do Not Modify): Private [1]       B Medical/Surgery History Past Medical History:  Diagnosis Date  . Hypertension   . Stroke [facility])    No past surgical history on file.   A IV Location/Drains/Wounds Patient Lines/Drains/Airways Status   Active Line/Drains/Airways    Name:   Placement date:   Placement time:   Site:   Days:   Peripheral IV 08/16/18 Right Forearm   08/16/18    0613    Forearm   less than 1          Intake/Output Last 24 hours No intake or output data in the 24 hours ending 08/16/18 0742  Labs/Imaging Results for orders placed or performed during the hospital encounter of 08/16/18 (from the past 48 hour(s))  CBC     Status: Abnormal   Collection Time: 08/16/18  5:31 AM  Result Value Ref Range   WBC 48.6 (H) 4.0 - 10.5 K/uL   RBC 4.42 3.87 - 5.11 MIL/uL   Hemoglobin 9.7 (L) 12.0 - 15.0 g/dL   HCT 33.8 (L)  36.0 - 46.0 %   MCV 76.5 (L) 80.0 - 100.0 fL   MCH 21.9 (L) 26.0 - 34.0 pg   MCHC 28.7 (L) 30.0 - 36.0 g/dL   RDW 23.2 (H) 11.5 - 15.5 %   Platelets 287 150 - 400 K/uL    Comment: REPEATED TO VERIFY   nRBC 1.1 (H) 0.0 - 0.2 %    Comment: Performed at [facility], [address] [place], [place] [address]  Comprehensive metabolic panel     Status: Abnormal   Collection Time: 08/16/18  5:31 AM  Result Value Ref Range   Sodium 138 135 - 145 mmol/L   Potassium 4.2 3.5 - 5.1 mmol/L   Chloride 104 98 - 111 mmol/L   CO2 22 22 - 32 mmol/L   Glucose, Bld 123 (H) 70 - 99 mg/dL   BUN 11 6 - 20 mg/dL   Creatinine, Ser 1.19 (H) 0.44 - 1.00 mg/dL   Calcium 8.7 (L) 8.9 - 10.3 mg/dL   Total Protein 6.4 (L) 6.5 - 8.1 g/dL   Albumin 3.1 (L) 3.5 - 5.0 g/dL   AST 31 15 - 41 U/L   ALT 17 0 - 44 U/L   Alkaline Phosphatase 64 38 - 126 U/L   Total Bilirubin 1.2 0.3 - 1.2 mg/dL   GFR  calc non Af Amer 54 (L) >60 mL/min   GFR calc Af Amer >60 >60 mL/min   Anion gap 12 5 - 15    Comment: Performed at [facility] [address] [place], [place] [address]  Brain natriuretic peptide     Status: Abnormal   Collection Time: 08/16/18  5:31 AM  Result Value Ref Range   B Natriuretic Peptide 2,151.1 (H) 0.0 - 100.0 pg/mL    Comment: Performed at [facility] [address] [place], [place] [address]  Troponin I - ONCE - STAT     Status: None   Collection Time: 08/16/18  5:31 AM  Result Value Ref Range   Troponin I <0.03 <0.03 ng/mL    Comment: Performed at [facility] [address], [place], [place] [address]  Protime-INR     Status: Abnormal   Collection Time: 08/16/18  6:28 AM  Result Value Ref Range   Prothrombin Time 40.0 (H) 11.4 - 15.2 seconds   INR 4.2 (HH) 0.8 - 1.2    Comment: REPEATED TO VERIFY CRITICAL RESULT CALLED TO, READ BACK BY AND VERIFIED WITHBerlinda Last RN [id number] BY A BENNETT Performed at [facility], [place] [address] [place],   [address]   POCT I-Stat EG7     Status: Abnormal   Collection Time: 08/16/18  7:26 AM  Result Value Ref Range   pH, Ven 7.388 7.250 - 7.430   pCO2, Ven 41.5 (L) 44.0 - 60.0 mmHg   pO2, Ven 45.0 32.0 - 45.0 mmHg   Bicarbonate 25.0 20.0 - 28.0 mmol/L   TCO2 26 22 - 32 mmol/L   O2 Saturation 80.0 %   Sodium 140 135 - 145 mmol/L   Potassium 3.7 3.5 - 5.1 mmol/L   Calcium, Ion 1.22 1.15 - 1.40 mmol/L   HCT 36.0 36.0 - 46.0 %   Hemoglobin 12.2 12.0 - 15.0 g/dL   Patient temperature HIDE    Sample type VENOUS    Dg Chest 2 View  Result Date: 08/16/2018 CLINICAL DATA:  Shortness of breath EXAM: CHEST - 2 VIEW COMPARISON:  07/14/2018 FINDINGS: Cardiomegaly. Mild vascular congestion. There is no edema, consolidation, effusion, or pneumothorax. No acute osseous finding IMPRESSION: Cardiomegaly and vascular congestion. Electronically Signed   By: Monte Fantasia M.D.   On: 08/16/2018 06:24    Pending Labs Unresulted Labs (From admission, onward)   None      Vitals/Pain Today's Vitals   08/16/18 0615 08/16/18 0630 08/16/18 0700 08/16/18 0732  BP:   (!) 106/91   Pulse: 78 77    Resp: (!) 21 (!) 21    Temp:      TempSrc:      SpO2: 100% 98%    Weight:      Height:      PainSc:    0-No pain    Isolation Precautions No active isolations  Medications Medications  furosemide (LASIX) injection 40 mg (40 mg Intravenous Given 08/16/18 2458)    Mobility walks Low fall risk   Focused Assessments Cardiac Assessment Handoff:    Lab Results  Component Value Date   TROPONINI <0.03 08/16/2018   No results found for: DDIMER Does the Patient currently have chest pain? No    R Recommendations: See Admitting Provider Note  Report given to:   Additional Notes:  Labs are notable for leukocytosis 48.6.  This is likely secondary to myeloproliferative illness that is currently being worked  up.   BNP  2151.1 INR  4.2  Pt extremely [facility]

## 2018-08-16 NOTE — ED Notes (Signed)
Pt.undressed into [place] and on the monitor

## 2018-08-16 NOTE — ED Notes (Signed)
Pt urinated approx. 200 ml

## 2018-08-16 NOTE — ED Notes (Signed)
Pt desating on RA to 88-89%. Placed on 2L . PA informed. Will continue to monitor

## 2018-08-16 NOTE — Progress Notes (Signed)
At 1610 central tele called that pt had 8 beats run of V tachy, pt. Denies pain, BP still high but is coming down. MD notified no new order given. Will continue to monitor the patient.

## 2018-08-16 NOTE — ED Notes (Signed)
Pt transported to xray 

## 2018-08-16 NOTE — H&P (Signed)
Date: 08/16/2018               Patient Name:  Charlotte Wyatt MRN: [id number]  DOB: 06-Mar-1971 Age / Sex: 48 y.o., female   PCP: Patient, No Pcp Per         Medical Service: Internal Medicine Teaching Service         Attending Physician: Dr. Heber [place], Rachel Moulds, DO    First Contact: Dr. Sherry Ruffing Pager: [phone]  Second Contact: Dr. 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    She was recently admit in march for acute on chronic heart failure exacerbation. She was diuresed with IV furosemide and weight at the time of discharge was 83 kg. Echocardiogram showed EF 20-25% at the time of discharge she was prescribed furosemide 40 mg BID. She describes running out of the lasix about one week after discharge and was not aware that there were refills available at her pharmacy. She does note that she had a three month supply of her other medications including lisinopril and carvedilol. She attempted to take an over the counter water pill, she thinks it may have been Diurex , however eventually she developed progressively worsening lower extremity edema. She also notes that she thinks shes been eating foods high in salt. The edema became especially worse this past week and was associated with abdominal distention and a productive cough. She denies shortness of breath, chest pain, or fever.   In the ED she was afebrile and hemodynamically stable, weight was 89.4 kg. Labs revealed trop <0.03 x 2 , crt 1.19 ( bl 0.9), leukocytes 48, hemoglobin 9.7, and INR 4.2, BNP 200. CXR showed pulmonary  edema and cardiomegaly. EKG showed T wave inversions in lateral leads which have been seen on prior EKG.   Meds:  Current Meds  Medication Sig   acetaminophen (TYLENOL) 500 MG tablet Take 1,000 mg by mouth every 6 (six) hours as needed for mild pain.   carvedilol (COREG) 25 MG tablet Take 25 mg by mouth 2 (two) times daily with a meal.   furosemide (LASIX) 40 MG tablet Take 1 tablet (40 mg total) by mouth 2 (two) times daily.   hydrALAZINE (APRESOLINE) 25 MG tablet Take 25 mg by mouth 3 (three) times daily.   lisinopril (PRINIVIL,ZESTRIL) 40 MG tablet Take 40 mg by mouth daily.   OVER THE COUNTER MEDICATION Take 1 tablet by mouth 3 (three) times daily. OTC Water Pill Green in color   spironolactone (ALDACTONE) 25 MG tablet Take 1 tablet (25 mg total) by mouth daily for 30 days.   warfarin (COUMADIN) 5 MG tablet Take 7.5 mg by mouth daily.      Allergies: Allergies as of 08/16/2018   (No Known Allergies)   Past Medical History:  Diagnosis Date   Hypertension    Stroke [facility])    Family History:  Mother - HTN   Social History:  She has a home in [place] but often visits her daughter in [place]. She is disabled due to heart failure and stroke deficits. She has decided to  move to [facility] and establish with a new PCP here. She denies tobacco, alcohol or illicit medication use.   Review of Systems: A complete ROS was negative except as per HPI.  Physical Exam: Blood pressure (!) 173/106, pulse 79, temperature (!) 97.5 F (36.4 C), temperature source Oral, resp. rate 20, height 5\' 2"  (1.575 m), weight 87.7 kg, SpO2 97 %. General: well appearing, not in acute distress  HEENT: normocephalic, atraumatic, no scleral icterus  Cardiac: regular rate and rhythm, no murmurs  Pulm: normal work of breathing, bilateral rhonchi left > right  GI: bowel sounds are present, the abdomen is soft, non tender, non distended  Extremities: 2+ bilateral lower extremity pitting edema to  the hips, no sacral edema  Neuro: alert, oriented, conversational  Skin: no evidence of rashes   EKG: personally reviewed my interpretation is sinus rhythm, T wave inversions in V4 and V5  CXR: personally reviewed my interpretation is pulmonary edema and cardiomegaly   Assessment & Plan by Problem:    Acute exacerbation of CHF (congestive heart failure) ([place])   Essential (primary) hypertension 48 year old woman with PMH systolic heart failure secondary to non ischemic cardiomyopathy, stroke which were most likely embolic, ventricular thrombus on coumadin presented to the emergency department with leg swelling and abdominal distention for the past week. Workup is consistent with acute exacerbation of CHF due to reduced access to medications and increased salt intake.  - continue IV lasix 40 mg BID - strict intake and output and daily weights  - follow electrolytes and renal function  - continue home carvedilol, lisinopril  - continue hydralazine   Leukocytosis  During prior hospitalization she was noted to have one year of worsening leukocytosis with increased basophils and borderline thrombocytosis. It was thought that the etiology of the leukocytosis was myeloproliferative so Dr. Beryle Beams our hematologist/oncologist was consulted and Jak 2 and BCR- ABL mutation testing were obtained.  Jak2 [id number] mutation was not detected. BCR - ABL fusion mutation testing was positive. 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 I will discuss this with hematology oncology but do not think it is contributing to her acute presentation.   Ventricular thrombus  INR supratheraputic at 4.2 at the time of admission.  - consult pharmacy for coumadin   Dispo: Admit patient to Inpatient with expected length of stay greater than 2 midnights.  Signed: Ledell Noss, MD 08/16/2018, 2:28 PM  Pager: [phone]

## 2018-08-16 NOTE — Progress Notes (Signed)
ANTICOAGULATION CONSULT NOTE  Pharmacy Consult for warfarin Indication: apical thrombus   Assessment: 39 yof on warfarin PTA for apical thrombus with hx of CHF presenting with leg swelling after reportedly not taking Lasix x 3 weeks. Pharmacy consulted to dose warfarin inpatient. INR supratherapeutic on admit at 4.2. CBC wnl. No active bleed issues documented.  PTA warfarin dose: med rec pending  Goal of Therapy:  INR 2-3 Monitor platelets by anticoagulation protocol: Yes   Plan:  Hold warfarin tonight with supratherapeutic INR Monitor daily INR, CBC, s/sx bleeding F/u warfarin home dose  Elicia Lamp, PharmD, BCPS Clinical Pharmacist 08/16/2018 8:03 AM

## 2018-08-16 NOTE — ED Triage Notes (Signed)
Pt came in by POV with daughter with bilateral leg swelling that has persisted for approx. A week. Hx of stroke. Alert and oriented

## 2018-08-16 NOTE — TOC Initial Note (Signed)
Transition of Care [facility]) - Initial/Assessment Note    Patient Details  Name: Charlotte Wyatt MRN: [id number] Date of Birth: [birth year]  Transition of [facility]) CM/SW Contact:    Royston Bake, RN Phone Number: 08/16/2018, 12:31 PM  Clinical Narrative:                 Patient is in [place] visiting daughter from [place]; PCP is Dr Tor Netters with [facility] [phone]); has private insurance with Medicare/ Medicaid with prescription drug coverage; patient is independent of her ADL's and plans to return home soon. CM will continue to follow for progression of care.  Expected Discharge Plan: Home/Self Care Barriers to Discharge: No Barriers Identified   Patient Goals and CMS Choice Patient states their goals for this hospitalization and ongoing recovery are:: to get better CMS Medicare.gov Compare Post Acute Care list provided to:: Patient Choice offered to / list presented to : NA  Expected Discharge Plan and Services Expected Discharge Plan: Home/Self Care In-house Referral: NA Discharge Planning Services: CM Consult Post Acute Care Choice: NA Living arrangements for the past 2 months: Single Family Home                 DME Arranged: N/A DME Agency: NA HH Arranged: NA HH Agency: NA  Prior Living Arrangements/Services Living arrangements for the past 2 months: Single Family Home Lives with:: Adult Children Patient language and need for interpreter reviewed:: No Do you feel safe going back to the place where you live?: Yes      Need for Family Participation in Patient Care: No (Comment) Care giver support system in place?: Yes (comment)   Criminal Activity/Legal Involvement Pertinent to Current Situation/Hospitalization: No - Comment as needed  Activities of Daily Living Home Assistive Devices/Equipment: None ADL Screening (condition at time of admission) Patient's cognitive ability adequate to safely complete daily activities?: Yes Is  the patient deaf or have difficulty hearing?: No Does the patient have difficulty seeing, even when wearing glasses/contacts?: No Does the patient have difficulty concentrating, remembering, or making decisions?: Yes(sometimes) Patient able to express need for assistance with ADLs?: Yes Does the patient have difficulty dressing or bathing?: No Independently performs ADLs?: Yes (appropriate for developmental age) Does the patient have difficulty walking or climbing stairs?: Yes Weakness of Legs: Both Weakness of Arms/Hands: Both  Permission Sought/Granted Permission sought to share information with : Case Manager Permission granted to share information with : Yes, Verbal Permission Granted  Share Information with NAME: Celso Sickle daughter           Emotional Assessment Appearance:: Developmentally appropriate Attitude/Demeanor/Rapport: Gracious Affect (typically observed): Accepting Orientation: : Oriented to Self, Oriented to  Time, Oriented to Place, Oriented to Situation Alcohol / Substance Use: Not Applicable Psych Involvement: No (comment)  Admission diagnosis:  Acute on chronic systolic (congestive) heart failure (HCC) [I50.23] Patient Active Problem List   Diagnosis Date Noted  . Acute exacerbation of CHF (congestive heart failure) ([place]) 08/16/2018  . Acute on chronic HFrEF (heart failure with reduced ejection fraction) ([place]) 07/14/2018  . CHF (congestive heart failure) ([place]) 07/14/2018  . Coronary artery disease 07/14/2018  . Odontogenic infection 07/14/2018  . Essential (primary) hypertension 09/20/2017  . History of stroke 09/20/2017   PCP:  Patient, No Pcp Per Pharmacy:   [id number] - [place], [address] [place] [address] Phone: [phone] Fax: [place], [address]  [place] [address] Phone: [phone] Fax: [place], [place] [address] [place] [address] Phone: [phone] Fax: [phone]     Social Determinants of Health (SDOH) Interventions    Readmission Risk Interventions No flowsheet data found.

## 2018-08-16 NOTE — Plan of Care (Signed)
  Problem: Activity: Goal: Capacity to carry out activities will improve Outcome: Progressing   Problem: Education: Goal: Ability to demonstrate management of disease process will improve Outcome: Progressing Goal: Ability to verbalize understanding of medication therapies will improve Outcome: Progressing Goal: Individualized Educational Video(s) Outcome: Progressing   Problem: Cardiac: Goal: Ability to achieve and maintain adequate cardiopulmonary perfusion will improve Outcome: Progressing   Problem: Education: Goal: Knowledge of General Education information will improve Description Including pain rating scale, medication(s)/side effects and non-pharmacologic comfort measures Outcome: Progressing   Problem: Health Behavior/Discharge Planning: Goal: Ability to manage health-related needs will improve Outcome: Progressing   Problem: Clinical Measurements: Goal: Ability to maintain clinical measurements within normal limits will improve Outcome: Progressing Goal: Will remain free from infection Outcome: Progressing Goal: Diagnostic test results will improve Outcome: Progressing Goal: Respiratory complications will improve Outcome: Progressing Goal: Cardiovascular complication will be avoided Outcome: Progressing   Problem: Activity: Goal: Risk for activity intolerance will decrease Outcome: Progressing   Problem: Nutrition: Goal: Adequate nutrition will be maintained Outcome: Progressing   Problem: Coping: Goal: Level of anxiety will decrease Outcome: Progressing   Problem: Elimination: Goal: Will not experience complications related to bowel motility Outcome: Progressing Goal: Will not experience complications related to urinary retention Outcome: Progressing   Problem: Pain Managment: Goal: General experience of comfort will improve Outcome: Progressing   Problem: Safety: Goal: Ability to remain free from injury will improve Outcome: Progressing    Problem: Skin Integrity: Goal: Risk for impaired skin integrity will decrease Outcome: Progressing

## 2018-08-16 NOTE — ED Provider Notes (Signed)
[facility] EMERGENCY DEPARTMENT Provider Note   CSN: [id number] Arrival date & time: 08/16/18  0449    History   Chief Complaint Chief Complaint  Patient presents with  . Leg Swelling    HPI Charlotte Wyatt is a 48 y.o. female with a history of CAD, HTN, CVA, and chronic systolic congestive heart failure with reduced ejection fraction who presents to the emergency department with a chief complaint of leg swelling.  The patient endorses worsening swelling in her bilateral legs and abdomen over the last week.  She reports that she has been weighing herself daily and typically weighs around ~185 lbs, but was around 196 when she weighed herself yesterday.  She reports that she was discharged with Lasix and took the medication for about a week before running out.   The patient appears to have some confusion because she then reports she has been taking "the green 100 mg water pill" (Keflex?) "that is available over the counter" every 6 hours for the last few weeks, but "stopped taking it yesterday because it didn't seen to be helping with my swelling.   She reports associated shortness of breath, orthopnea, productive cough, and left-sided chest tightness that is been gradually worsening over the last few days.  She reports that she typically sleeps with 2-3 pillows at night.  Chest tightness and shortness of breath have been worse with laying flat.   She denies fever, chills, nausea, vomiting, abdominal pain, back pain, palpitations, or headache.  She reports that she has been staying with her daughter for the last few weeks after she had a tooth pulled about 2 to 3 weeks ago.  She reports that she has been eating more soups recently as she thinks that her daughter may use more salt that she cooks with at home.  Reports that she was smoking 3 cigarettes daily up until a few weeks ago when she stopped smoking.  She is a poor historian.      The history is provided by the  patient.    Past Medical History:  Diagnosis Date  . Hypertension   . Stroke [facility])     Patient Active Problem List   Diagnosis Date Noted  . Acute on chronic HFrEF (heart failure with reduced ejection fraction) ([place]) 07/14/2018  . CHF (congestive heart failure) ([place]) 07/14/2018  . Coronary artery disease 07/14/2018  . Odontogenic infection 07/14/2018  . Essential (primary) hypertension 09/20/2017  . History of stroke 09/20/2017    No past surgical history on file.   OB History   No obstetric history on file.      Home Medications    Prior to Admission medications   Medication Sig Start Date End Date Taking? Authorizing Provider  acetaminophen (TYLENOL) 500 MG tablet Take 1,000 mg by mouth every 6 (six) hours as needed for mild pain.    [provider]  carvedilol (COREG) 25 MG tablet Take 25 mg by mouth 2 (two) times daily with a meal.    [provider]  furosemide (LASIX) 40 MG tablet Take 1 tablet (40 mg total) by mouth 2 (two) times daily. 07/18/18   Rehman, Areeg N, DO  hydrALAZINE (APRESOLINE) 25 MG tablet Take 25 mg by mouth 3 (three) times daily.    [provider]  lisinopril (PRINIVIL,ZESTRIL) 40 MG tablet Take 40 mg by mouth daily.    [provider]  spironolactone (ALDACTONE) 25 MG tablet Take 1 tablet (25 mg total) by mouth daily for  30 days. 07/17/18 08/16/18  Rehman, Areeg N, DO  warfarin (COUMADIN) 5 MG tablet Take 7.5 mg by mouth daily. Taking 1 &1/2 tablet (7.5mg ) daily    [provider]    Family History No family history on file.  Social History Social History   Tobacco Use  . Smoking status: Current Every Day Smoker    Packs/day: 0.25  . Smokeless tobacco: Never Used  Substance Use Topics  . Alcohol use: Yes    Comment: occasionally  . Drug use: Never     Allergies   Patient has no known allergies.   Review of Systems Review of Systems  Constitutional: Negative for chills and fever.  HENT:  Negative for ear pain and sore throat.   Eyes: Negative for pain and visual disturbance.  Respiratory: Positive for cough, chest tightness and shortness of breath. Negative for wheezing.        Orthopnea  Cardiovascular: Positive for leg swelling. Negative for chest pain and palpitations.  Gastrointestinal: Positive for abdominal distention. Negative for abdominal pain, constipation, diarrhea, nausea and vomiting.  Genitourinary: Negative for dysuria, flank pain, hematuria and urgency.  Musculoskeletal: Negative for arthralgias, back pain, myalgias, neck pain and neck stiffness.  Skin: Negative for color change and rash.  Neurological: Negative for seizures, syncope and weakness.  All other systems reviewed and are negative.  Physical Exam Updated Vital Signs BP (!) 106/91   Pulse 77   Temp 98.5 F (36.9 C) (Oral)   Resp (!) 21   Ht 5\' 2"  (1.575 m)   Wt 89.4 kg   SpO2 98%   BMI 36.03 kg/m   Physical Exam Vitals signs and nursing note reviewed.  Constitutional:      General: She is not in acute distress. HENT:     Head: Normocephalic.  Eyes:     Extraocular Movements: Extraocular movements intact.     Conjunctiva/sclera: Conjunctivae normal.     Pupils: Pupils are equal, round, and reactive to light.  Neck:     Musculoskeletal: Normal range of motion and neck supple. No neck rigidity.  Cardiovascular:     Rate and Rhythm: Normal rate and regular rhythm.     Heart sounds: No murmur. No friction rub. No gallop.   Pulmonary:     Effort: Pulmonary effort is normal. No respiratory distress.     Breath sounds: Normal breath sounds. No stridor.     Comments: Crackles auscultated bilaterally with scattered intermittent wheezes.  No respiratory distress or increased work of breathing. Chest:     Chest wall: No tenderness.  Abdominal:     General: There is distension.     Palpations: Abdomen is soft. There is no mass.     Tenderness: There is no abdominal tenderness. There is no  right CVA tenderness, left CVA tenderness, guarding or rebound.     Hernia: No hernia is present.     Comments: Abdomen is distended but soft.  Nontender.  Musculoskeletal:     Right lower leg: Edema present.     Left lower leg: Edema present.  Lymphadenopathy:     Cervical: No cervical adenopathy.  Skin:    General: Skin is warm.     Capillary Refill: Capillary refill takes less than 2 seconds.     Findings: No rash.     Comments: Radial, DP, PT pulses are 2+ and symmetric.  No mottling of the extremities.  Neurological:     Mental Status: She is alert.  Comments: Seems mildly confused, but is alert and oriented x3.  Follows simple commands.  Moves all 4 extremities.  Psychiatric:        Behavior: Behavior normal.      ED Treatments / Results  Labs (all labs ordered are listed, but only abnormal results are displayed) Labs Reviewed  CBC - Abnormal; Notable for the following components:      Result Value   WBC 48.6 (*)    Hemoglobin 9.7 (*)    HCT 33.8 (*)    MCV 76.5 (*)    MCH 21.9 (*)    MCHC 28.7 (*)    RDW 23.2 (*)    nRBC 1.1 (*)    All other components within normal limits  COMPREHENSIVE METABOLIC PANEL - Abnormal; Notable for the following components:   Glucose, Bld 123 (*)    Creatinine, Ser 1.19 (*)    Calcium 8.7 (*)    Total Protein 6.4 (*)    Albumin 3.1 (*)    GFR calc non Af Amer 54 (*)    All other components within normal limits  PROTIME-INR - Abnormal; Notable for the following components:   Prothrombin Time 40.0 (*)    INR 4.2 (*)    All other components within normal limits  TROPONIN I  BRAIN NATRIURETIC PEPTIDE  I-STAT VENOUS BLOOD GAS, ED    EKG EKG Interpretation  Date/Time:  Wednesday August 16 2018 06:15:36 EDT Ventricular Rate:  77 PR Interval:    QRS Duration: 103 QT Interval:  426 QTC Calculation: 483 R Axis:   -55 Text Interpretation:  Sinus rhythm Probable left atrial enlargement Left anterior fascicular block Abnormal  R-wave progression, late transition Borderline repolarization abnormality No significant change was found Confirmed by Ezequiel Essex [phone]) on 08/16/2018 6:20:56 AM   Radiology Dg Chest 2 View  Result Date: 08/16/2018 CLINICAL DATA:  Shortness of breath EXAM: CHEST - 2 VIEW COMPARISON:  07/14/2018 FINDINGS: Cardiomegaly. Mild vascular congestion. There is no edema, consolidation, effusion, or pneumothorax. No acute osseous finding IMPRESSION: Cardiomegaly and vascular congestion. Electronically Signed   By: Monte Fantasia M.D.   On: 08/16/2018 06:24    Procedures Procedures (including critical care time)  Medications Ordered in ED Medications  furosemide (LASIX) injection 40 mg (40 mg Intravenous Given 08/16/18 3016)     Initial Impression / Assessment and Plan / ED Course  I have reviewed the triage vital signs and the nursing notes.  Pertinent labs & imaging results that were available during my care of the patient were reviewed by me and considered in my medical decision making (see chart for details).  Clinical Course as of Aug 16 723  Wed Aug 16, 2018  0545 Notified by nursing staff that the patient was de-satting to 88% with good waveform on the monitor and was placed on 2 L nasal cannula with improvement of oxygen saturation to the 90s.   [MM]    Clinical Course User Index [MM] Zakir Henner A, PA-C       48 year old female with a history of CAD, HTN, CVA, and chronic systolic congestive heart failure with reduced ejection fraction presenting with shortness of breath, chest tightness, orthopnea, productive cough, and abdominal and leg swelling for the last week.  She has not been taking furosemide for the last 3 weeks after running out of the medication.  There appears to be some confusion as the patient describes a green pill that she has been taking every 6 hours, which she thought  was a diuretic.  It sounds as if she may be taking Keflex?    On arrival, she is  hypertensive and afebrile.  She has no tachycardia.  She is not hypoxic and oxygen saturation is in the mid 90s with good waveform on the monitor.  Will check labs, chest x-ray, EKG, and give the patient a dose of IV Lasix and reassess.  Patient was discussed with Dr. Wyvonnia Dusky, attending physician.  Labs are notable for leukocytosis 48.6.  This is likely secondary to myeloproliferative illness that is currently being worked up.  After speaking with the patient's daughter on the phone, she does not recall that the patient has followed up with hematology since she was last discharged from the hospital.  Labs are otherwise notable today for INR of 4.2.  BNP is pending.  Consulted the internal medicine team and spoke with Dr. Sharon Seller with the IM team will accept the patient for admission for acute on chronic CHF exacerbation. The patient appears reasonably stabilized for admission considering the current resources, flow, and capabilities available in the ED at this time, and I doubt any other [facility] requiring further screening and/or treatment in the ED prior to admission.  Final Clinical Impressions(s) / ED Diagnoses   Final diagnoses:  Acute on chronic systolic (congestive) heart failure [facility])    ED Discharge Orders    None       Joanne Gavel, PA-C 08/16/18 0725    Ezequiel Essex, MD 08/16/18 [phone]

## 2018-08-17 ENCOUNTER — Telehealth: Payer: Self-pay | Admitting: Hematology

## 2018-08-17 DIAGNOSIS — I5023 Acute on chronic systolic (congestive) heart failure: Secondary | ICD-10-CM

## 2018-08-17 LAB — CBC WITH DIFFERENTIAL/PLATELET
Abs Immature Granulocytes: 3.9 10*3/uL — ABNORMAL HIGH (ref 0.00–0.07)
Band Neutrophils: 2 %
Basophils Absolute: 2.6 10*3/uL — ABNORMAL HIGH (ref 0.0–0.1)
Basophils Relative: 6 %
Eosinophils Absolute: 0.9 10*3/uL — ABNORMAL HIGH (ref 0.0–0.5)
Eosinophils Relative: 2 %
HCT: 35.8 % — ABNORMAL LOW (ref 36.0–46.0)
Hemoglobin: 10.5 g/dL — ABNORMAL LOW (ref 12.0–15.0)
Lymphocytes Relative: 10 %
Lymphs Abs: 4.3 10*3/uL — ABNORMAL HIGH (ref 0.7–4.0)
MCH: 22 pg — ABNORMAL LOW (ref 26.0–34.0)
MCHC: 29.3 g/dL — ABNORMAL LOW (ref 30.0–36.0)
MCV: 74.9 fL — ABNORMAL LOW (ref 80.0–100.0)
Metamyelocytes Relative: 6 %
Monocytes Absolute: 1.7 10*3/uL — ABNORMAL HIGH (ref 0.1–1.0)
Monocytes Relative: 4 %
Myelocytes: 3 %
Neutro Abs: 29.8 10*3/uL — ABNORMAL HIGH (ref 1.7–7.7)
Neutrophils Relative %: 67 %
Platelets: 229 10*3/uL (ref 150–400)
RBC: 4.78 MIL/uL (ref 3.87–5.11)
RDW: 23.2 % — ABNORMAL HIGH (ref 11.5–15.5)
WBC: 43.2 10*3/uL — ABNORMAL HIGH (ref 4.0–10.5)
nRBC: 0 /100 WBC
nRBC: 0.8 % — ABNORMAL HIGH (ref 0.0–0.2)

## 2018-08-17 LAB — BASIC METABOLIC PANEL
Anion gap: 13 (ref 5–15)
BUN: 11 mg/dL (ref 6–20)
CO2: 23 mmol/L (ref 22–32)
Calcium: 8.3 mg/dL — ABNORMAL LOW (ref 8.9–10.3)
Chloride: 102 mmol/L (ref 98–111)
Creatinine, Ser: 1.1 mg/dL — ABNORMAL HIGH (ref 0.44–1.00)
GFR calc Af Amer: >60 mL/min (ref >60)
GFR calc non Af Amer: 59 mL/min — ABNORMAL LOW (ref >60)
Glucose, Bld: 102 mg/dL — ABNORMAL HIGH (ref 70–99)
Potassium: 4 mmol/L (ref 3.5–5.1)
Sodium: 138 mmol/L (ref 135–145)

## 2018-08-17 LAB — PROTIME-INR
INR: 3.1 — ABNORMAL HIGH (ref 0.8–1.2)
Prothrombin Time: 31.2 seconds — ABNORMAL HIGH (ref 11.4–15.2)

## 2018-08-17 MED ORDER — WARFARIN SODIUM 7.5 MG PO TABS
7.5000 mg | ORAL_TABLET | Freq: Once | ORAL | Status: AC
  Administered 2018-08-17: 18:00:00 7.5 mg via ORAL
  Filled 2018-08-17: qty 1

## 2018-08-17 MED ORDER — WARFARIN - PHARMACIST DOSING INPATIENT: Freq: Every day | Status: DC

## 2018-08-17 MED ORDER — FUROSEMIDE 10 MG/ML IJ SOLN
40.0000 mg | Freq: Two times a day (BID) | INTRAMUSCULAR | Status: DC
  Administered 2018-08-17 – 2018-08-18 (×3): 40 mg via INTRAVENOUS
  Filled 2018-08-17 (×3): qty 4

## 2018-08-17 NOTE — Progress Notes (Signed)
Pt BP is high. Pt keeps stopping in mid conversation and eyes roll to back of the head. MD notified. Awaiting orders. Will continue to monitor pt

## 2018-08-17 NOTE — Telephone Encounter (Signed)
Received a call from Olga Coaster, case manager from [facility] to schedule a hem appt. A new hem appt has been scheduled for the to seee Dr. Irene Limbo on 4/16 at 1pm. Per Ms. Tamera Punt, the pt will be staying with her daughter who lives in [place].

## 2018-08-17 NOTE — Progress Notes (Signed)
Subjective: Charlotte Wyatt does believe that her LE edema has improved since yesterday. She also believes that she is breathing well. She has been able to get out of bed and walk around. She does not have any acute complaints. She states that she "is ready to go." We discussed that since we should try to get her down to her dry weight and arrange her outpatient follow up since she will be staying in [place]. We discussed the plan for today and she is in agreement.   Objective:  Vital signs in last 24 hours: Vitals:   08/17/18 0008 08/17/18 0013 08/17/18 0500 08/17/18 0523  BP: (!) 162/94 (!) 158/92 (!) 176/98 (!) 166/82  Pulse: 76  67   Resp: 20  20   Temp: 98.2 F (36.8 C)  98.5 F (36.9 C)   TempSrc: Oral  Oral   SpO2: 96%  94% 96%  Weight:   84.9 kg   Height:        General:Lying in bed in no acute distress Cardiac: Normal rate, regular rhythm, JVD present with HJR Pulmonary: Normal WOB,, normal oxygen sats on room air, CTABL Abdomen: Non-tender to palpation, soft Extremity: 1+ LE edema noted to the mid-calf bilaterally, no TTP/warmth/erythema Psychiatry: Normal behavior, mood, and affect    Assessment/Plan:  Principal Problem:   Acute exacerbation of CHF (congestive heart failure) (HCC) Active Problems:   Essential (primary) hypertension   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  She had recently been admitted for acute heart failure exacerbation and had been diuresed with a discharge weight of 83 kg.  She had been out of her Lasix and had not been taking any thing for about 2 weeks.  He also endorsed high sodium intake.  BNP was elevated to 200, chest x-ray showed pulmonary edema and cardiomegaly.  Patient was admitted for diuresis.   Acute on chronic heart failure exacerbation: Patient received 40 mg IV lasix x2 yesterday, she has had a good response to this.  She has had a net output of 2.3 L, weight down to 84.9 kg from 87.7 kg on admission.  She was down to 83 kg on her last discharge which appears to be her dry weight.  On exam she has improvement in her lower extremity edema, she still has a hepatojugular reflux however her lungs CTA BL.  Her labs have been stable, no increase in her creatinine today.  She appears to be diuresing well liters and she still has some diarrhea continue IV diuresis for now, once she is closer to her dry weight she can be discharged with Lasix. -Continue IV Lasix 40 mg twice daily -Continue carvedilol, lisinopril, and hydralazine -Strict I+Os -Daily weights -Heart healthy diet -Daily BMPs  Leukocytosis: -Patient was noted to have worsening leukocytosis on her prior hospitalizations continue with increased basophils borderline thrombocytosis.  Was thought to be myeloproliferative at that time, Jak 2 was negative, BCR able mutation testing was positive.  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  Labs showed a WBC of 43, 1 to 2% metamyelocytes and myelocytes, neutrophils 29, absolute lymphs 4.3, absolute monocytes 1.7, absolute eosinophils 0.9, and absolute basophils 2.6. -She will need a hematology oncology follow-up here in [place]. -Appreciate case manager assisting with finding follow-up  Ventricular thrombus: -On admission  INR was supratherapeutic at 4.2, however warfarin was held yesterday and her INR was 3.1 today.  We are continuing to hold coumadin, appreciate pharmacy assistance with this.  FEN: No fluids, replete lytes prn, heart healthy diet  VTE ppx: SCDs Code Status: FULL    Dispo: Anticipated discharge in approximately 1-2 day(s).   Asencion Noble, MD 08/17/2018, 6:54 AM Pager: [phone]

## 2018-08-17 NOTE — Progress Notes (Addendum)
ANTICOAGULATION CONSULT NOTE  Pharmacy Consult for warfarin Indication: apical thrombus   Assessment: 33 yof on warfarin PTA for apical thrombus with hx of CHF presenting with leg swelling after reportedly not taking Lasix x 3 weeks. Pharmacy consulted to dose warfarin inpatient. INR supratherapeutic on admit at 4.2. -INR= 3.1   PTA warfarin dose: 7.5mg  po daily  Goal of Therapy:  INR 2-3 Monitor platelets by anticoagulation protocol: Yes   Plan:  Coumadin 7.5mg  today Monitor daily INR, CBC, s/sx bleeding   Hildred Laser, PharmD Clinical Pharmacist **Pharmacist phone directory can now be found on [email or web address] ([email or web address]).  Listed under [place].

## 2018-08-17 NOTE — Progress Notes (Signed)
Paged by RN that patient's blood pressure was elevated to 166/82 and that her eyes kept closing or rolling back in her head while talking with the nurse. I evaluated the patient at bedside. She states that she feels sleepy, but otherwise feels well. She denies headache, vision changes, or chest pain. She is alert and oriented x3. She understands that she is in the hospital for swelling in her legs.  On exam, patient was alert and oriented. She responded appropriately to questions asked in a loud voice as the patient is hard of hearing and followed commands. PERRL and EOMI without eyes rolling back in her head. 5/5 strength in the bilateral upper extremities.   The patient appears tired, but otherwise she seems at baseline based on the day team's notes. Will proceed with previously scheduled blood pressure medications this morning including coreg, hydralazine, and lisinopril.

## 2018-08-17 NOTE — Discharge Summary (Signed)
Name: Charlotte Wyatt MRN: [id number] DOB: 1971/02/01 48 y.o. PCP: Patient, No Pcp Per  Date of Admission: 08/16/2018  4:51 AM Date of Discharge:  Attending Physician: Lucious Groves, DO  Discharge Diagnosis: 1. Acute on chronic heart failure exacerbation 2. Leukocytosis 3. Ventricular thrombus  Discharge Medications: Allergies as of 08/18/2018   No Known Allergies     Medication List    STOP taking these medications   OVER THE COUNTER MEDICATION     TAKE these medications   acetaminophen 500 MG tablet Commonly known as:  TYLENOL Take 1,000 mg by mouth every 6 (six) hours as needed for mild pain.   carvedilol 25 MG tablet Commonly known as:  COREG Take 25 mg by mouth 2 (two) times daily with a meal.   furosemide 40 MG tablet Commonly known as:  LASIX Take 1 tablet (40 mg total) by mouth 2 (two) times daily.   hydrALAZINE 25 MG tablet Commonly known as:  APRESOLINE Take 25 mg by mouth 3 (three) times daily.   lisinopril 40 MG tablet Commonly known as:  PRINIVIL,ZESTRIL Take 1 tablet (40 mg total) by mouth daily.   spironolactone 25 MG tablet Commonly known as:  ALDACTONE Take 1 tablet (25 mg total) by mouth daily for 30 days.   warfarin 5 MG tablet Commonly known as:  COUMADIN Take 5 mg tablets (1 pill) every Monday and Thursday, take 7.5 mg tablets (1.5 pills) every other day. What changed:    how much to take  how to take this  when to take this  additional instructions       Disposition and follow-up:   Charlotte Wyatt was discharged from [facility] in Stable condition.  At the hospital follow up visit please address:  1. Acute on chronic heart failure exacerbation: She should be on lasix 40 mg BID, Coreg, Lisinopril, and hydralazine. Discharge weight of 82.9 kg.   Leukocytosis: BCR-ABL was positive on last admission, WBC elevated to 43. She has follow up with Hematologist Dr. Irene Limbo.  Ventricular thrombus: Found to be  supratherapeutic to 4.2, wafarin dose was decreased from 7.5 daily to 5 mg on Monday and Thursday and 7.5 mg on the other days. Please recheck INR and adjust as needed.   2.  Labs / imaging needed at time of follow-up: CBC, BMP, INR  3.  Pending labs/ test needing follow-up: None  Follow-up Appointments: Follow-up Information    [place]. Go on 08/24/2018.   Why:  @3 :30pm Contact information: [place] [address] [phone]       Dr Suzan Slick [facility] Follow up on 08/24/2018.   Why:  at 1 pm; please try to keep your apt or call to reschedule Contact information: [facility] [place], [place] [address] Tele # [place] by problem list:  1. 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  She had recently been admitted for acute heart failure exacerbation and had been diuresed with a discharge weight of 83 kg.  She had been out of her Lasix and had not been taking any thing for about 2 weeks, she is from [place] and her PCP  is there but she is now planning to stay in [place] with her daughter. She also endorsed high sodium intake.  BNP was elevated to 200, chest x-ray showed pulmonary edema and cardiomegaly.  Patient was admitted for diuresis. She received IV lasix and had good output and weight loss. She was disuresed down to her dry weight of 83 kg. She symptomatically improved and her LE resolved. She was restarted on her home lasix 40 mg BID and prescription was sent to transitions of care. She has follow up arranged with a PCP in [place].   2. Leukocytosis: This was noted on her prior hospitalization, she was having  worsening leukocytosis with increased basophils.  His smear was noted to have metamyelocytes and promyelocytes.  They checked JAK-2 and BCR-ABL mutation.  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 She will follow up with Hematology/oncology.     3. Ventricular thrombus: She was on warfarin 7.5 mg daily at home, on admission her INR was supratherapeutic at 4.2. No evidence of bleeding. Warfarin was held and her INR improved down to 2.1. She was restarted on a lower dose of Warfarin to 5 mg on Monday and Thursday and 7.5 mg all the other days. She will need INR checked to adjust her dose.  Discharge Vitals:   BP (!) 156/90 (BP Location: Right Arm)   Pulse 77   Temp 98.6 F (37 C) (Oral)   Resp 18   Ht 5\' 2"  (1.575 m)   Wt 82.8 kg   SpO2 100%   BMI 33.40 kg/m   Pertinent Labs, Studies, and Procedures:   CBC Latest Ref Rng & Units 08/17/2018 08/16/2018 08/16/2018  WBC 4.0 - 10.5 K/uL 43.2(H) - 48.6(H)  Hemoglobin 12.0 - 15.0 g/dL 10.5(L) 12.2 9.7(L)  Hematocrit 36.0 - 46.0 % 35.8(L) 36.0 33.8(L)  Platelets 150 - 400 K/uL 229 - 287   BMP Latest Ref Rng & Units 08/17/2018 08/16/2018 08/16/2018  Glucose 70 - 99 mg/dL 102(H) - 123(H)  BUN 6 - 20 mg/dL 11 - 11  Creatinine 0.44 - 1.00 mg/dL 1.10(H) - 1.19(H)  Sodium 135 - 145 mmol/L 138 140 138  Potassium 3.5 - 5.1 mmol/L 4.0 3.7 4.2  Chloride 98 - 111 mmol/L 102 - 104  CO2 22 - 32 mmol/L 23 - 22  Calcium 8.9 - 10.3 mg/dL 8.3(L) - 8.7(L)     Discharge Instructions: Discharge Instructions    (HEART FAILURE PATIENTS) Call MD:  Anytime you have any of the following symptoms: 1) 3 pound weight gain in 24 hours or 5 pounds in 1 week 2) shortness of breath, with or without a dry hacking cough 3) swelling in the hands, feet or stomach 4) if you have to sleep on extra pillows at night in order to breathe.   Complete by:  As directed    Call MD for:  difficulty breathing, headache or visual disturbances   Complete by:  As directed    Call MD for:  extreme  fatigue   Complete by:  As directed    Call MD for:  hives   Complete by:  As directed    Call MD for:  persistant dizziness or light-headedness   Complete by:  As directed    Call MD for:  persistant nausea and vomiting   Complete by:  As directed    Call MD for:  redness, tenderness, or signs of infection (pain, swelling, redness, odor or green/yellow discharge around incision site)   Complete by:  As directed  Call MD for:  severe uncontrolled pain   Complete by:  As directed    Call MD for:  temperature >100.4   Complete by:  As directed    Diet - low sodium heart healthy   Complete by:  As directed    Discharge instructions   Complete by:  As directed    Ledon Snare,   It has been a pleasure working with you and we are glad you're feeling better. You were hospitalized for heart failure.   For your heart failure,  START taking Lasix 40 mg twice a day Continue to take your Coreg, Hydralazine, Lisinopril, and Spironolactone Please follow up with your new PCP next week to have your labs checked Please check your weights daily and keep a record of it, if you gain >3 lbs in one day or >5 lbs over two days please contact your new PCP   Please adjust your warfarin dosage, you may have been taking too much Start taking 5 mg (1 tablet) of your warfarin on Monday and Thursday, and 7.5 mg (1.5 tablets) on the other days Please follow up with your PCP to recheck your labs and make adjustments as needed  You were noted to have abnormal labs with an elevation in your white count. Please follow up with the hematologist, Dr. Irene Limbo, to discuss further testing.  Follow up with your primary care provider in 1-2 weeks  If your symptoms worsen or you develop new symptoms, please seek medical help whether it is your primary care provider or emergency department.  If you have any questions about this hospitalization please call [phone].   Increase activity slowly   Complete by:  As  directed       Signed: Asencion Noble, MD 08/18/2018, 11:12 AM   Pager: [phone]

## 2018-08-17 NOTE — Progress Notes (Signed)
Referral made to the [facility] for August 24, 2018 at 1 pm; also patient can follow up at the [facility] for primary care. Mindi Slicker [facility] [phone]

## 2018-08-18 DIAGNOSIS — Z9114 Patient's other noncompliance with medication regimen: Secondary | ICD-10-CM

## 2018-08-18 LAB — PROTIME-INR
INR: 2.1 — ABNORMAL HIGH (ref 0.8–1.2)
Prothrombin Time: 23.6 seconds — ABNORMAL HIGH (ref 11.4–15.2)

## 2018-08-18 MED ORDER — WARFARIN SODIUM 7.5 MG PO TABS: 7.5000 mg | ORAL_TABLET | Freq: Once | ORAL | Status: DC

## 2018-08-18 MED ORDER — LISINOPRIL 40 MG PO TABS: 40.0000 mg | ORAL_TABLET | Freq: Every day | ORAL | 0 refills | Status: DC

## 2018-08-18 MED ORDER — FUROSEMIDE 40 MG PO TABS: 40.0000 mg | ORAL_TABLET | Freq: Two times a day (BID) | ORAL | 0 refills | Status: DC

## 2018-08-18 MED ORDER — WARFARIN SODIUM 5 MG PO TABS: ORAL_TABLET | ORAL | 0 refills | Status: DC

## 2018-08-18 MED FILL — FUROSEMIDE 40 MG TABLET: 40 | 30 days supply | Qty: 60 | Fill #0

## 2018-08-18 MED FILL — WARFARIN SODIUM 5 MG TABLET: 5 | 18 days supply | Qty: 26 | Fill #0

## 2018-08-18 MED FILL — LISINOPRIL 40 MG TABS: 40 | 30 days supply | Qty: 30 | Fill #0

## 2018-08-18 NOTE — Discharge Instructions (Signed)
Charlotte Wyatt,   It has been a pleasure working with you and we are glad you're feeling better. You were hospitalized for heart failure.   For your heart failure,  START taking Lasix 40 mg twice a day Continue to take your Coreg, Hydralazine, Lisinopril, and Spironolactone Please follow up with your new PCP next week to have your labs checked Please check your weights daily and keep a record of it, if you gain >3 lbs in one day or >5 lbs over two days please contact your new PCP   Please adjust your warfarin dosage, you may have been taking too much Start taking 5 mg (1 tablet) of your warfarin on Monday and Thursday, and 7.5 mg (1.5 tablets) on the other days Please follow up with your PCP to recheck your labs and make adjustments as needed  You were noted to have abnormal labs with an elevation in your white count. Please follow up with the hematologist, Dr. Irene Limbo, to discuss further testing.  Follow up with your primary care provider in 1-2 weeks  If your symptoms worsen or you develop new symptoms, please seek medical help whether it is your primary care provider or emergency department.  If you have any questions about this hospitalization please call [phone].    Heart Failure Heart failure is a condition in which the heart has trouble pumping blood because it has become weak or stiff. This means that the heart does not pump blood efficiently for the body to work well. For some people with heart failure, fluid may back up into the lungs and there may be swelling (edema) in the lower legs. Heart failure is usually a long-term (chronic) condition. It is important for you to take good care of yourself and follow the treatment plan from your health care provider. What are the causes? This condition is caused by some health problems, including:  High blood pressure (hypertension). Hypertension causes the heart muscle to work harder than normal. High blood pressure eventually  causes the heart to become stiff and weak.  Coronary artery disease (CAD). CAD is the buildup of cholesterol and fat (plaques) in the arteries of the heart.  Heart attack (myocardial infarction). Injured tissue, which is caused by the heart attack, does not contract as well and the heart's ability to pump blood is weakened.  Abnormal heart valves. When the heart valves do not open and close properly, the heart muscle must pump harder to keep the blood flowing.  Heart muscle disease (cardiomyopathy or myocarditis). Heart muscle disease is damage to the heart muscle from a variety of causes, such as drug or alcohol abuse, infections, or unknown causes. These can increase the risk of heart failure.  Lung disease. When the lungs do not work properly, the heart must work harder. What increases the risk? Risk of heart failure increases as a person ages. This condition is also more likely to develop in people who:  Are overweight.  Are female.  Smoke or chew tobacco.  Abuse alcohol or illegal drugs.  Have taken medicines that can damage the heart, such as chemotherapy drugs.  Have diabetes. ? High blood sugar (glucose) is associated with high fat (lipid) levels in the blood. ? Diabetes can also damage tiny blood vessels that carry nutrients to the heart muscle.  Have abnormal heart rhythms.  Have thyroid problems.  Have low blood counts (anemia). What are the signs or symptoms? Symptoms of this condition include:  Shortness of breath with activity, such as  when climbing stairs.  Persistent cough.  Swelling of the feet, ankles, legs, or abdomen.  Unexplained weight gain.  Difficulty breathing when lying flat (orthopnea).  Waking from sleep because of the need to sit up and get more air.  Rapid heartbeat.  Fatigue and loss of energy.  Feeling light-headed, dizzy, or close to fainting.  Loss of appetite.  Nausea.  Increased urination during the night  (nocturia).  Confusion. How is this diagnosed? This condition is diagnosed based on:  Medical history, symptoms, and a physical exam.  Diagnostic tests, which may include: ? Echocardiogram. ? Electrocardiogram (ECG). ? Chest X-ray. ? Blood tests. ? Exercise stress test. ? Radionuclide scans. ? Cardiac catheterization and angiogram. How is this treated? Treatment for this condition is aimed at managing the symptoms of heart failure. Medicines, behavioral changes, or other treatments may be necessary to treat heart failure. Medicines These may include:  Angiotensin-converting enzyme (ACE) inhibitors. This type of medicine blocks the effects of a blood protein called angiotensin-converting enzyme. ACE inhibitors relax (dilate) the blood vessels and help to lower blood pressure.  Angiotensin receptor blockers (ARBs). This type of medicine blocks the actions of a blood protein called angiotensin. ARBs dilate the blood vessels and help to lower blood pressure.  Water pills (diuretics). Diuretics cause the kidneys to remove salt and water from the blood. The extra fluid is removed through urination, leaving a lower volume of blood that the heart has to pump.  Beta blockers. These improve heart muscle strength and they prevent the heart from beating too quickly.  Digoxin. This increases the force of the heartbeat. Healthy behavior changes These may include:  Reaching and maintaining a healthy weight.  Stopping smoking or chewing tobacco.  Eating heart-healthy foods.  Limiting or avoiding alcohol.  Stopping use of street drugs (illegal drugs).  Physical activity. Other treatments These may include:  Surgery to open blocked coronary arteries or repair damaged heart valves.  Placement of a biventricular pacemaker to improve heart muscle function (cardiac resynchronization therapy). This device paces both the right ventricle and left ventricle.  Placement of a device to treat  serious abnormal heart rhythms (implantable cardioverter defibrillator, or ICD).  Placement of a device to improve the pumping ability of the heart (left ventricular assist device, or LVAD).  Heart transplant. This can cure heart failure, and it is considered for certain patients who do not improve with other therapies. Follow these instructions at home: Medicines  Take over-the-counter and prescription medicines only as told by your health care provider. Medicines are important in reducing the workload of your heart, slowing the progression of heart failure, and improving your symptoms. ? Do not stop taking your medicine unless your health care provider told you to do that. ? Do not skip any dose of medicine. ? Refill your prescriptions before you run out of medicine. You need your medicines every day. Eating and drinking   Eat heart-healthy foods. Talk with a dietitian to make an eating plan that is right for you. ? Choose foods that contain no trans fat and are low in saturated fat and cholesterol. Healthy choices include fresh or frozen fruits and vegetables, fish, lean meats, legumes, fat-free or low-fat dairy products, and whole-grain or high-fiber foods. ? Limit salt (sodium) if directed by your health care provider. Sodium restriction may reduce symptoms of heart failure. Ask a dietitian to recommend heart-healthy seasonings. ? Use healthy cooking methods instead of frying. Healthy methods include roasting, grilling, broiling, baking, poaching, steaming,  and stir-frying.  Limit your fluid intake if directed by your health care provider. Fluid restriction may reduce symptoms of heart failure. Lifestyle   Stop smoking or using chewing tobacco. Nicotine and tobacco can damage your heart and your blood vessels. Do not use nicotine gum or patches before talking to your health care provider.  Limit alcohol intake to no more than 1 drink per day for non-pregnant women and 2 drinks per day  for men. One drink equals 12 oz of beer, 5 oz of wine, or 1 oz of hard liquor. ? Drinking more than that is harmful to your heart. Tell your health care provider if you drink alcohol several times a week. ? Talk with your health care provider about whether any level of alcohol use is safe for you. ? If your heart has already been damaged by alcohol or you have severe heart failure, drinking alcohol should be stopped completely.  Stop use of illegal drugs.  Lose weight if directed by your health care provider. Weight loss may reduce symptoms of heart failure.  Do moderate physical activity if directed by your health care provider. People who are elderly and people with severe heart failure should consult with a health care provider for physical activity recommendations. Monitor important information   Weigh yourself every day. Keeping track of your weight daily helps you to notice excess fluid sooner. ? Weigh yourself every morning after you urinate and before you eat breakfast. ? Wear the same amount of clothing each time you weigh yourself. ? Record your daily weight. Provide your health care provider with your weight record.  Monitor and record your blood pressure as told by your health care provider.  Check your pulse as told by your health care provider. Dealing with extreme temperatures  If the weather is extremely hot: ? Avoid vigorous physical activity. ? Use air conditioning or fans or seek a cooler location. ? Avoid caffeine and alcohol. ? Wear loose-fitting, lightweight, and light-colored clothing.  If the weather is extremely cold: ? Avoid vigorous physical activity. ? Layer your clothes. ? Wear mittens or gloves, a hat, and a scarf when you go outside. ? Avoid alcohol. General instructions  Manage other health conditions such as hypertension, diabetes, thyroid disease, or abnormal heart rhythms as told by your health care provider.  Learn to manage stress. If you  need help to do this, ask your health care provider.  Plan rest periods when fatigued.  Get ongoing education and support as needed.  Participate in or seek rehabilitation as needed to maintain or improve independence and quality of life.  Stay up to date with immunizations. Keeping current on pneumococcal and influenza immunizations is especially important to prevent respiratory infections.  Keep all follow-up visits as told by your health care provider. This is important. Contact a health care provider if:  You have a rapid weight gain.  You have increasing shortness of breath that is unusual for you.  You are unable to participate in your usual physical activities.  You tire easily.  You cough more than normal, especially with physical activity.  You have any swelling or more swelling in areas such as your hands, feet, ankles, or abdomen.  You are unable to sleep because it is hard to breathe.  You feel like your heart is beating quickly (palpitations).  You become dizzy or light-headed when you stand up. Get help right away if:  You have difficulty breathing.  You notice or your family notices  a change in your awareness, such as having trouble staying awake or having difficulty with concentration.  You have pain or discomfort in your chest.  You have an episode of fainting (syncope). This information is not intended to replace advice given to you by your health care provider. Make sure you discuss any questions you have with your health care provider. Document Released: 04/26/2005 Document Revised: 03/25/2017 Document Reviewed: 11/19/2015 Elsevier Interactive Patient Education  [employer].

## 2018-08-18 NOTE — Plan of Care (Signed)
  Problem: Activity: Goal: Capacity to carry out activities will improve Outcome: Progressing   Problem: Education: Goal: Ability to demonstrate management of disease process will improve Outcome: Progressing Goal: Ability to verbalize understanding of medication therapies will improve Outcome: Progressing Goal: Individualized Educational Video(s) Outcome: Progressing   Problem: Cardiac: Goal: Ability to achieve and maintain adequate cardiopulmonary perfusion will improve Outcome: Progressing   Problem: Education: Goal: Knowledge of General Education information will improve Description Including pain rating scale, medication(s)/side effects and non-pharmacologic comfort measures Outcome: Progressing   Problem: Health Behavior/Discharge Planning: Goal: Ability to manage health-related needs will improve Outcome: Progressing   Problem: Clinical Measurements: Goal: Ability to maintain clinical measurements within normal limits will improve Outcome: Progressing Goal: Will remain free from infection Outcome: Progressing Goal: Diagnostic test results will improve Outcome: Progressing Goal: Respiratory complications will improve Outcome: Progressing Goal: Cardiovascular complication will be avoided Outcome: Progressing   Problem: Activity: Goal: Risk for activity intolerance will decrease Outcome: Progressing   Problem: Nutrition: Goal: Adequate nutrition will be maintained Outcome: Progressing   Problem: Coping: Goal: Level of anxiety will decrease Outcome: Progressing   Problem: Elimination: Goal: Will not experience complications related to bowel motility Outcome: Progressing Goal: Will not experience complications related to urinary retention Outcome: Progressing   Problem: Pain Managment: Goal: General experience of comfort will improve Outcome: Progressing   Problem: Safety: Goal: Ability to remain free from injury will improve Outcome: Progressing    Problem: Skin Integrity: Goal: Risk for impaired skin integrity will decrease Outcome: Progressing

## 2018-08-18 NOTE — Progress Notes (Signed)
Reviewed AVS discharge instructions with patient/caregiver. Patient/caregiver verbalizes understanding of instructions received. AVS received by patient/caregiver. If present, telemetry box removed and central cardiac monitoring department notified of discharge. Peripheral IV removed, site benign with tip intact. Patient awaiting ride for discharge home. [facility] will meet patient at discharge to give medication.

## 2018-08-18 NOTE — Progress Notes (Signed)
Subjective: Charlotte Wyatt reports that she is doing fine, no acute events overnight. She reports that her leg swelling has improved and is wanting to go home. She denied any new complaints. She was very reserved today. We discussed that she should be able to be discharged today and follow up with her new PCP to repeat labs. She is in agreement.   Objective:  Vital signs in last 24 hours: Vitals:   08/17/18 2140 08/18/18 0032 08/18/18 0102 08/18/18 0325  BP: (!) 149/92 (!) 161/87  (!) 156/90  Pulse:  77 74 77  Resp:  18  18  Temp:  98.8 F (37.1 C)  98.6 F (37 C)  TempSrc:  Oral  Oral  SpO2:  (!) 86% 93% 100%  Weight:  82.8 kg    Height:        General: Well appearing female, NAD, resting comfortably in bed Cardiac: RRR, no m/r/g Pulmonary: CTABL, no wheezing or rhonchi Abdomen: Soft, non-tender, non-distended Extremity:No LE edema, no erythema or wounds Psychiatry: Flat affect, reserved, minimal responses    Assessment/Plan:  Principal Problem:   Acute exacerbation of CHF (congestive heart failure) (HCC) Active Problems:   Essential (primary) hypertension   Left ventricular thrombus without MI   Leukocytosis   Acute on chronic systolic (congestive) heart failure (HCC)  This is a 48 year old female with a history of HFrEF (last EF of 20-25%), 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  She had recently been admitted for acute heart failure exacerbation and had been diuresed with a discharge weight of 83 kg.  She had been out of her Lasix and had not been taking any thing for about 2 weeks.  He also endorsed high sodium intake.  BNP was elevated to 200, chest x-ray showed pulmonary edema and cardiomegaly.  Patient was admitted for diuresis.  Acute on chronic heart failure exacerbation: Patient received 40 mg IV lasix BID yesterday and has had a good response. She had a  net output of 1.2L, and weight is down to 82.9 kg today from 87.7 on admission. Her last discharge weight was 83 kg so she has diuresed down to her dry weight. On exam she appear euvolemic. Labs have been stable. Since this was likely caused due to not taking her Lasix we will make sure that she has a prescription for this and use transitions of care to try to make sure that she has it on discharge.  -Resumre 40 mg PO lasix BID on discharge -Continue carvedilol, lisinopril, and hydralazine -F/u with PCP in [place] in 1-2 weeks -Heart healthy diet  Leukocytosis: -Patient was noted to have worsening leukocytosis on her prior hospitalizations continue with increased basophils borderline thrombocytosis.  Was thought to be myeloproliferative at that time, Jak 2 was negative, BCR able mutation testing was positive.  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  Labs showed a WBC of 43, 1 to 2% metamyelocytes and myelocytes, neutrophils 29, absolute lymphs 4.3, absolute monocytes 1.7, absolute eosinophils 0.9, and absolute basophils 2.6. -She will need a hematology oncology follow-up here in [place]. -F/u with Dr. Irene Limbo in [facility]  Ventricular thrombus: -On admission INR was supratherapeutic at 4.2, however warfarin was held yesterday and her INR was 2.1 today.   -She was on 7.5 mg warfarin daily at home, will decrease this by about 10% and have her take 5 mg tablets twice a week and 7.5 mg every other day  on discharge -F/u with PCP in 1 week to repeat labs   FEN: No fluids, replete lytes prn, heart healthy diet  VTE ppx: SCDs Code Status: FULL     Dispo: Anticipated discharge in approximately today.   Charlotte Noble, MD 08/18/2018, 6:49 AM Pager: [phone]

## 2018-08-18 NOTE — Progress Notes (Signed)
Pt refuses to wear oxygen.RN took pt O2 which was 77%. Pt educated and states "I do not need it". When RN was trying to put O2 probe on pt finger pt slapped RN arm away. RN continued to educate on why pt should be wearing oxygen. Pt eventually agreed to put oxygen on. Pt now on 3L [place] and is at 100%. Will continue to monitor pt

## 2018-08-18 NOTE — Progress Notes (Signed)
ANTICOAGULATION CONSULT NOTE  Pharmacy Consult for warfarin Indication: apical thrombus   Assessment: 22 yof on warfarin PTA for apical thrombus with hx of CHF presenting with leg swelling after reportedly not taking Lasix x 3 weeks. Pharmacy consulted to dose warfarin inpatient. INR supratherapeutic on admit at 4.2. -INR= 2.1   PTA warfarin dose: 7.5mg  po daily  Goal of Therapy:  INR 2-3 Monitor platelets by anticoagulation protocol: Yes   Plan:  Coumadin 7.5mg  today Monitor daily INR,   Hildred Laser, PharmD Clinical Pharmacist **Pharmacist phone directory can now be found on [email or web address] ([email or web address]).  Listed under [facility].

## 2018-08-18 NOTE — Progress Notes (Signed)
Pt refuses bed alarm. 

## 2018-08-23 ENCOUNTER — Encounter: Payer: Self-pay | Admitting: Critical Care Medicine

## 2018-08-24 ENCOUNTER — Inpatient Hospital Stay: Payer: Medicare Other | Attending: Hematology | Admitting: Hematology

## 2018-08-24 ENCOUNTER — Telehealth: Payer: Self-pay | Admitting: Hematology

## 2018-08-24 ENCOUNTER — Inpatient Hospital Stay: Payer: Medicare Other

## 2018-08-24 ENCOUNTER — Other Ambulatory Visit: Payer: Self-pay

## 2018-08-24 ENCOUNTER — Ambulatory Visit: Payer: Medicare Other | Attending: Critical Care Medicine | Admitting: Critical Care Medicine

## 2018-08-24 ENCOUNTER — Encounter: Payer: Self-pay | Admitting: Critical Care Medicine

## 2018-08-24 VITALS — BP 136/73 | HR 68 | Temp 99.0°F | Resp 17 | Ht 62.0 in | Wt 178.5 lb

## 2018-08-24 VITALS — BP 132/70 | HR 65 | Temp 97.9°F | Resp 17 | Wt 180.4 lb

## 2018-08-24 DIAGNOSIS — F17210 Nicotine dependence, cigarettes, uncomplicated: Secondary | ICD-10-CM | POA: Diagnosis not present

## 2018-08-24 DIAGNOSIS — Z8249 Family history of ischemic heart disease and other diseases of the circulatory system: Secondary | ICD-10-CM | POA: Diagnosis not present

## 2018-08-24 DIAGNOSIS — Z8673 Personal history of transient ischemic attack (TIA), and cerebral infarction without residual deficits: Secondary | ICD-10-CM | POA: Insufficient documentation

## 2018-08-24 DIAGNOSIS — E782 Mixed hyperlipidemia: Secondary | ICD-10-CM | POA: Diagnosis not present

## 2018-08-24 DIAGNOSIS — Z79899 Other long term (current) drug therapy: Secondary | ICD-10-CM | POA: Diagnosis not present

## 2018-08-24 DIAGNOSIS — I5023 Acute on chronic systolic (congestive) heart failure: Secondary | ICD-10-CM | POA: Diagnosis present

## 2018-08-24 DIAGNOSIS — I10 Essential (primary) hypertension: Secondary | ICD-10-CM | POA: Diagnosis not present

## 2018-08-24 DIAGNOSIS — R634 Abnormal weight loss: Secondary | ICD-10-CM

## 2018-08-24 DIAGNOSIS — C9210 Chronic myeloid leukemia, BCR/ABL-positive, not having achieved remission: Secondary | ICD-10-CM

## 2018-08-24 DIAGNOSIS — Z23 Encounter for immunization: Secondary | ICD-10-CM | POA: Insufficient documentation

## 2018-08-24 DIAGNOSIS — I313 Pericardial effusion (noninflammatory): Secondary | ICD-10-CM | POA: Diagnosis not present

## 2018-08-24 DIAGNOSIS — I5022 Chronic systolic (congestive) heart failure: Secondary | ICD-10-CM

## 2018-08-24 DIAGNOSIS — I513 Intracardiac thrombosis, not elsewhere classified: Secondary | ICD-10-CM

## 2018-08-24 DIAGNOSIS — I110 Hypertensive heart disease with heart failure: Secondary | ICD-10-CM | POA: Diagnosis not present

## 2018-08-24 DIAGNOSIS — I2510 Atherosclerotic heart disease of native coronary artery without angina pectoris: Secondary | ICD-10-CM

## 2018-08-24 DIAGNOSIS — Z720 Tobacco use: Secondary | ICD-10-CM

## 2018-08-24 DIAGNOSIS — D72824 Basophilia: Secondary | ICD-10-CM

## 2018-08-24 DIAGNOSIS — Z7901 Long term (current) use of anticoagulants: Secondary | ICD-10-CM | POA: Insufficient documentation

## 2018-08-24 DIAGNOSIS — D649 Anemia, unspecified: Secondary | ICD-10-CM | POA: Diagnosis not present

## 2018-08-24 DIAGNOSIS — I240 Acute coronary thrombosis not resulting in myocardial infarction: Secondary | ICD-10-CM

## 2018-08-24 LAB — CBC WITH DIFFERENTIAL/PLATELET
Abs Immature Granulocytes: 13.8 10*3/uL — ABNORMAL HIGH (ref 0.00–0.07)
Band Neutrophils: 20 %
Basophils Absolute: 2.1 10*3/uL — ABNORMAL HIGH (ref 0.0–0.1)
Basophils Relative: 3 %
Eosinophils Absolute: 2.8 10*3/uL — ABNORMAL HIGH (ref 0.0–0.5)
Eosinophils Relative: 4 %
HCT: 46 % (ref 36.0–46.0)
Hemoglobin: 13.4 g/dL (ref 12.0–15.0)
Lymphocytes Relative: 7 %
Lymphs Abs: 4.8 10*3/uL — ABNORMAL HIGH (ref 0.7–4.0)
MCH: 21.9 pg — ABNORMAL LOW (ref 26.0–34.0)
MCHC: 29.1 g/dL — ABNORMAL LOW (ref 30.0–36.0)
MCV: 75.3 fL — ABNORMAL LOW (ref 80.0–100.0)
Metamyelocytes Relative: 15 %
Monocytes Absolute: 2.1 10*3/uL — ABNORMAL HIGH (ref 0.1–1.0)
Monocytes Relative: 3 %
Myelocytes: 5 %
Neutro Abs: 43.5 10*3/uL — ABNORMAL HIGH (ref 1.7–17.7)
Neutrophils Relative %: 43 %
Platelets: 452 10*3/uL — ABNORMAL HIGH (ref 150–400)
RBC: 6.11 MIL/uL — ABNORMAL HIGH (ref 3.87–5.11)
RDW: 23.9 % — ABNORMAL HIGH (ref 11.5–15.5)
WBC: 69 10*3/uL (ref 4.0–10.5)
nRBC: 0.1 % (ref 0.0–0.2)

## 2018-08-24 LAB — CMP (CANCER CENTER ONLY)
ALT: 17 U/L (ref 0–44)
AST: 30 U/L (ref 15–41)
Albumin: 3.5 g/dL (ref 3.5–5.0)
Alkaline Phosphatase: 91 U/L (ref 38–126)
Anion gap: 11 (ref 5–15)
BUN: 22 mg/dL — ABNORMAL HIGH (ref 6–20)
CO2: 28 mmol/L (ref 22–32)
Calcium: 9.5 mg/dL (ref 8.9–10.3)
Chloride: 101 mmol/L (ref 98–111)
Creatinine: 0.94 mg/dL (ref 0.44–1.00)
GFR, Est AFR Am: >60 mL/min (ref >60)
GFR, Estimated: >60 mL/min (ref >60)
Glucose, Bld: 72 mg/dL (ref 70–99)
Potassium: 4.7 mmol/L (ref 3.5–5.1)
Sodium: 140 mmol/L (ref 135–145)
Total Bilirubin: 0.4 mg/dL (ref 0.3–1.2)
Total Protein: 8.1 g/dL (ref 6.5–8.1)

## 2018-08-24 LAB — SAMPLE TO BLOOD BANK

## 2018-08-24 LAB — LACTATE DEHYDROGENASE: LDH: 600 U/L — ABNORMAL HIGH (ref 98–192)

## 2018-08-24 MED ORDER — WARFARIN SODIUM 5 MG PO TABS: ORAL_TABLET | ORAL | 1 refills | Status: DC

## 2018-08-24 MED ORDER — HYDRALAZINE HCL 25 MG PO TABS: 25.0000 mg | ORAL_TABLET | Freq: Three times a day (TID) | ORAL | 6 refills | Status: DC

## 2018-08-24 MED ORDER — FUROSEMIDE 40 MG PO TABS: 40.0000 mg | ORAL_TABLET | Freq: Two times a day (BID) | ORAL | 0 refills | Status: DC

## 2018-08-24 MED ORDER — LISINOPRIL 40 MG PO TABS: 40.0000 mg | ORAL_TABLET | Freq: Every day | ORAL | 0 refills | Status: DC

## 2018-08-24 MED ORDER — CARVEDILOL 25 MG PO TABS: 25.0000 mg | ORAL_TABLET | Freq: Two times a day (BID) | ORAL | 6 refills | Status: DC

## 2018-08-24 NOTE — Assessment & Plan Note (Signed)
Left ventricular thrombus without myocardial infarction for this we will maintain warfarin and enroll in the warfarin clinic  Today we will asked the patient to increase to 7/2 mg x 1 dose today and then go back to her regular schedule of 7/2 mg daily except for 5 mg Monday and Thursday

## 2018-08-24 NOTE — Progress Notes (Signed)
Subjective:    Patient ID: Charlotte Wyatt, female    DOB: 09-Feb-1971, 48 y.o.   MRN: [id number]  This is a pleasant 48 year old female with a previous history of nonischemic cardiomyopathy with chronic systolic heart failure and previous stroke.  The stroke occurred in 2013 and her heart failure was diagnosed in 2014.  She has a left ventricular thrombus and is on chronic warfarin therapy.  She recently moved here from [facility] to be closer to her daughter.  All of her previous visits prior to March of this year were in the [place] area in the [facility] health system  It is unclear whether she has coronary artery disease or not by review of the chart however she is labeled as having nonischemic cardiomyopathy with longstanding difficult to control hypertension.  She was admitted twice since coming into the [place] [facility].  Once March 6 and then a month later April 8 both with decompensated heart failure.  The daughter states between the first and second visit she was not compliant with her diet.  Patient also still actively smokes a pack a day of cigarettes.  The second problem that has evolved is that at the last 2 visit she was found to have white count in the 40,000+ range.  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  She is now being followed by Dr. Irene Limbo of hematology, who in fact just saw the patient today and is scheduling a bone marrow biopsy.  The patient is not yet on therapy for the leukemia.  Excerpts from the recent admission in April is below  Date of Admission: 08/16/2018  4:51 AM Date of Discharge:  Attending Physician: Lucious Groves, DO 2016  [facility]   CVA 2013 Discharge Diagnosis: 1. Acute on chronic heart failure exacerbation 2. Leukocytosis 3. Ventricular thrombus  Discharge Medications: Allergies as of 08/18/2018  No Known Allergies  48 year old female with past medical history of nonischemic  cardiomyopathy with chronic systolic congestive heart failure, history of CVA and left ventricular thrombus on Coumadin who presented for worsening leg swelling.  As noted she was previously admitted to our inpatient service about a month ago for management of an acute heart failure exacerbation.  Symptoms this time are very similar with progressive leg swelling abdominal distention and some dyspnea on exertion.  No fever or sick contacts.  In the emergency department she was noted to be 6 kg above her last discharge weight chest x-ray revealed some pulmonary edema and cardiomegaly and her EKG was unchanged. My examination of her she is resting comfortably in bed no acute distress no supplemental oxygen requirement.  Lungs have some diminished basilar breath sounds otherwise clear, heart is regular rate and rhythm I did not appreciate a murmur, abdomen is soft and nontender she has 1+ pedal edema above her knees.   Disposition and follow-up:   Charlotte Wyatt was discharged from [facility] in Stable condition.  At the hospital follow up visit please address:  1.Acute on chronic heart failure exacerbation: She should be on lasix 40 mg BID, Coreg, Lisinopril, and hydralazine. Discharge weight of 82.9 kg.   Leukocytosis: BCR-ABL was positive on last admission, WBC elevated to 43. She has follow up with Hematologist Dr. Irene Limbo.  Ventricular thrombus: Found to be supratherapeutic to 4.2, wafarin dose was decreased from 7.5 daily to 5 mg on Monday and Thursday and 7.5 mg on the other days. Please recheck  INR and adjust as needed.   Note the patient was seen today by hematology and her white count is up to 69,000.  The rest of her complete metabolic profile is normal.  We checked an INR here in the office on point-of-care and it was less than 2.    7.5 x today,  Then reg schedule from there.  INR next .   193?# Wt Readings from Last 3 Encounters:  08/24/18 180 lb 6.4 oz (81.8  kg)  08/24/18 178 lb 8 oz (81 kg)  08/18/18 182 lb 9.6 oz (82.8 kg)   Past Medical History:  Diagnosis Date  . CAD (coronary artery disease)   . Cardiomyopathy, secondary ([place])   . Central hearing loss   . CHF (congestive heart failure) ([place])   . Essential hypertension   . History of stroke   . Menopausal symptoms   . Mixed hyperlipidemia   . Stroke [facility])      Family History  Problem Relation Age of Onset  . Hypertension Mother   . Diabetes Father   . Hypertension Father   . Heart disease Father      Social History   Socioeconomic History  . Marital status: Single    Spouse name: Not on file  . Number of children: 2  . Years of education: Not on file  . Highest education level: Not on file  Occupational History  . Not on file  Social Needs  . Financial resource strain: Not on file  . Food insecurity:    Worry: Not on file    Inability: Not on file  . Transportation needs:    Medical: Not on file    Non-medical: Not on file  Tobacco Use  . Smoking status: Current Every Day Smoker    Packs/day: 0.25  . Smokeless tobacco: Never Used  Substance and Sexual Activity  . Alcohol use: Yes    Comment: occasionally  . Drug use: Not Currently  . Sexual activity: Not on file  Lifestyle  . Physical activity:    Days per week: Not on file    Minutes per session: Not on file  . Stress: Not on file  Relationships  . Social connections:    Talks on phone: Not on file    Gets together: Not on file    Attends religious service: Not on file    Active member of club or organization: Not on file    Attends meetings of clubs or organizations: Not on file    Relationship status: Not on file  . Intimate partner violence:    Fear of current or ex partner: Not on file    Emotionally abused: Not on file    Physically abused: Not on file    Forced sexual activity: Not on file  Other Topics Concern  . Not on file  Social History Narrative  . Not on file     No Known  Allergies   Outpatient Medications Prior to Visit  Medication Sig Dispense Refill  . acetaminophen (TYLENOL) 500 MG tablet Take 1,000 mg by mouth every 6 (six) hours as needed for mild pain.    . carvedilol (COREG) 25 MG tablet Take 25 mg by mouth 2 (two) times daily with a meal.    . furosemide (LASIX) 40 MG tablet Take 1 tablet (40 mg total) by mouth 2 (two) times daily. 60 tablet 0  . hydrALAZINE (APRESOLINE) 25 MG tablet Take 25 mg by mouth 3 (three) times daily.    Marland Kitchen  lisinopril (PRINIVIL,ZESTRIL) 40 MG tablet Take 1 tablet (40 mg total) by mouth daily. 30 tablet 0  . warfarin (COUMADIN) 5 MG tablet Take 5 mg tablets (1 pill) every Monday and Thursday, take 7.5 mg tablets (1.5 pills) every other day. 26 tablet 0  . spironolactone (ALDACTONE) 25 MG tablet Take 1 tablet (25 mg total) by mouth daily for 30 days. 30 tablet 0   No facility-administered medications prior to visit.      Review of Systems  Constitutional: Negative.   HENT: Positive for hearing loss.        Receptive aphasia  Eyes: Negative.   Respiratory: Negative for cough, choking, chest tightness, shortness of breath and wheezing.   Cardiovascular: Negative for chest pain, palpitations and leg swelling.  Gastrointestinal: Negative for abdominal distention, abdominal pain, anal bleeding, blood in stool, constipation, diarrhea, nausea and vomiting.  Endocrine: Negative.   Genitourinary: Negative.   Musculoskeletal: Negative.   Neurological: Positive for speech difficulty. Negative for dizziness, tremors, seizures, facial asymmetry, weakness, light-headedness, numbness and headaches.  Hematological: Negative.  Negative for adenopathy.  Psychiatric/Behavioral: Negative.        Objective:   Physical Exam Vitals:   08/24/18 1545  BP: 132/70  Pulse: 65  Resp: 17  Temp: 97.9 F (36.6 C)  TempSrc: Oral  SpO2: 100%  Weight: 180 lb 6.4 oz (81.8 kg)    Gen: Pleasant, well-nourished, in no distress,  normal affect   ENT: No lesions,  mouth clear,  oropharynx clear, no postnasal drip  Neck: 1+ JVD, no TMG, no carotid bruits  Lungs: No use of accessory muscles, no dullness to percussion, clear without rales or rhonchi  Cardiovascular: RRR, heart sounds normal, no murmur +S3 , no peripheral edema  Abdomen: soft and NT, no HSM,  BS normal  Musculoskeletal: No deformities, no cyanosis or clubbing  Neuro: alert, non focal, receptive aphasia  Skin: Warm, no lesions or rashes BMP Latest Ref Rng & Units 08/24/2018 08/17/2018 08/16/2018  Glucose 70 - 99 mg/dL 72 102(H) -  BUN 6 - 20 mg/dL 22(H) 11 -  Creatinine 0.44 - 1.00 mg/dL 0.94 1.10(H) -  Sodium 135 - 145 mmol/L 140 138 140  Potassium 3.5 - 5.1 mmol/L 4.7 4.0 3.7  Chloride 98 - 111 mmol/L 101 102 -  CO2 22 - 32 mmol/L 28 23 -  Calcium 8.9 - 10.3 mg/dL 9.5 8.3(L) -   Lab Results  Component Value Date   WBC 69.0 (HH) 08/24/2018   HGB 13.4 08/24/2018   HCT 46.0 08/24/2018   MCV 75.3 (L) 08/24/2018   PLT 452 (H) 08/24/2018   Hepatic Function Latest Ref Rng & Units 08/24/2018 08/16/2018 07/14/2018  Total Protein 6.5 - 8.1 g/dL 8.1 6.4(L) 6.4(L)  Albumin 3.5 - 5.0 g/dL 3.5 3.1(L) 3.2(L)  AST 15 - 41 U/L _0 ALT 0 - 44 U/L _1 Alk Phosphatase 38 - 126 U/L 91 64 60  Total Bilirubin 0.3 - 1.2 mg/dL 0.4 1.2 0.7   Echo 07/2018 IMPRESSIONS  1. The left ventricle has severely reduced systolic function, with an ejection fraction of 20-25%. The cavity size was moderately dilated. There is mildly increased left ventricular wall thickness. Left ventricular diastolic Doppler parameters are  consistent with restrictive filling Elevated left ventricular end-diastolic pressure Left ventrical global hypokinesis without regional wall motion abnormalities.  2. The right ventricle has moderately reduced systolic function. The cavity was moderately enlarged. There is no increase in right ventricular wall  thickness.  3. Left atrial size was moderately dilated.   4. Right atrial size was mildly dilated.  5. Small pericardial effusion.  6. The pericardial effusion is posterior to the left ventricle.  7. The mitral valve is degenerative. Moderate thickening of the mitral valve leaflet. Moderate calcification of the mitral valve leaflet.  8. The tricuspid valve is normal in structure. Tricuspid valve regurgitation is moderate.  9. The aortic valve is tricuspid Mild thickening of the aortic valve Mild calcification of the aortic valve. Aortic valve regurgitation is mild by color flow Doppler. 10. The pulmonic valve was grossly normal. Pulmonic valve regurgitation is mild by color flow Doppler. 11. The inferior vena cava was dilated in size with <50% respiratory variability.  FINDINGS  Left Ventricle: The left ventricle has severely reduced systolic function, with an ejection fraction of 20-25%. The cavity size was moderately dilated. There is mildly increased left ventricular wall thickness. Left ventricular diastolic Doppler  parameters are consistent with restrictive filling Elevated left ventricular end-diastolic pressure Left ventrical global hypokinesis without regional wall motion abnormalities. Right Ventricle: The right ventricle has moderately reduced systolic function. The cavity was moderately enlarged. There is no increase in right ventricular wall thickness. Left Atrium: left atrial size was moderately dilated Right Atrium: right atrial size was mildly dilated. Right atrial pressure is estimated at 15 mmHg. Interatrial Septum: No atrial level shunt detected by color flow Doppler. Pericardium: A small pericardial effusion is present. The pericardial effusion is posterior to the left ventricle. Mitral Valve: The mitral valve is degenerative in appearance. Moderate thickening of the mitral valve leaflet. Moderate calcification of the mitral valve leaflet. Mitral valve regurgitation is mild by color flow Doppler. Tricuspid Valve: The tricuspid valve  is normal in structure. Tricuspid valve regurgitation is moderate by color flow Doppler. Aortic Valve: The aortic valve is tricuspid Mild thickening of the aortic valve Mild calcification of the aortic valve. Aortic valve regurgitation is mild by color flow Doppler. There is no evidence of aortic valve stenosis. Pulmonic Valve: The pulmonic valve was grossly normal. Pulmonic valve regurgitation is mild by color flow Doppler. Venous: The inferior vena cava is dilated in size with less than 50% respiratory variability.    4/8 CXR IMPRESSION: Cardiomegaly and vascular congestion.  PT INR 08/24/2018: 1.6    Assessment & Plan:  I personally reviewed all images and lab data in the [facility] system as well as any outside material available during this office visit and agree with the  radiology impressions.   CHF (congestive heart failure) (HCC) Acute on chronic systolic heart failure now compensated to chronic phase.  Ejection fraction 20 to 25%.  Secondary to severe longstanding hypertension.  Plan will be to continue lisinopril 40 mg daily, Apresoline 25 mg 3 times daily, furosemide 40 mg twice daily, Coreg at all 25 mg twice daily.  Note potassium level is adequate therefore will not need potassium replacements  We will make a referral to the heart failure clinic for this patient  Coronary artery disease The patient is listed as having coronary artery disease but we do not have cardiac catheterization records.  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  Diagnoses and all orders for this visit:  Chronic systolic congestive heart failure (HCC) -     AMB referral to CHF clinic  Essential (primary) hypertension  History of stroke  CML (chronic myelocytic leukemia) ([place])  Left ventricular thrombus without MI -     AMB referral to CHF clinic  Tobacco use  Coronary artery disease without angina pectoris, unspecified vessel or lesion type, unspecified whether native or transplanted heart  Basophilia  Other orders -     Discontinue: warfarin (COUMADIN) 5 MG tablet; Take 5 mg tablets (1 pill) every Monday and Thursday, take 7.5 mg tablets (1.5 pills) every other day. -     lisinopril (ZESTRIL) 40 MG tablet; Take 1 tablet (40 mg total) by mouth daily. -     hydrALAZINE (APRESOLINE) 25 MG tablet; Take 1 tablet (25 mg total) by mouth 3 (three) times daily. -     furosemide (LASIX) 40 MG tablet; Take 1 tablet (40 mg total) by mouth 2 (two) times daily. -     carvedilol (COREG) 25 MG tablet; Take 1 tablet (25 mg total) by mouth 2 (two) times daily with a meal. -     warfarin (COUMADIN) 5 MG tablet; Take 5 mg tablets (1 pill) every Monday and Thursday, take 7.5 mg tablets (1.5 pills) every other day.   We did administer a tetanus vaccine today  We also spent 5 minutes with smoking cessation counseling for the patient

## 2018-08-24 NOTE — Assessment & Plan Note (Signed)
The patient is listed as having coronary artery disease but we do not have cardiac catheterization records.

## 2018-08-24 NOTE — Assessment & Plan Note (Signed)
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

## 2018-08-24 NOTE — Patient Instructions (Addendum)
Take 7-1/2 mg which is 1-1/2 tablets today of your warfarin, then resume your normal schedule tomorrow which is 7-1/2 mg every day with the exception of 5 mg on Mondays and Thursdays  Refills on all medications were sent to our pharmacy and you can pick these up on Monday, April 20  A follow-up visit will be made in a month's time to establish for primary care  We will enroll you in our warfarin clinic with our clinical pharmacist for follow-up  A referral to the heart failure clinic will be made  A tetanus vaccine was administered

## 2018-08-24 NOTE — Assessment & Plan Note (Signed)
Leukocytosis likely due to [facility]  Further evaluation per oncology hematology

## 2018-08-24 NOTE — Progress Notes (Signed)
HEMATOLOGY/ONCOLOGY CONSULTATION NOTE  Date of Service: 08/24/2018  Patient Care Team: System, Pcp Not In as PCP - General  CHIEF COMPLAINTS/PURPOSE OF CONSULTATION:  Leukocytosis- likely CML  HISTORY OF PRESENTING ILLNESS:   Charlotte Wyatt is a wonderful 48 y.o. female who has been referred to [place] by Dr. Lonia Skinner for evaluation and management of her Leukocytosis. She is accompanied today by her daughter via cell phone. The pt reports that she is doing well overall.  Prior to today's visit, the pt was admitted in both early March and early April for her CHF. At both times, she was seen to have leukocytosis and a BCR-ABL was collected which was positive. She was then referred to see me.  The pt reports that she has "been good," in the last 6 months except when she has had fluid overload issues. The pt notes that her cardiac concerns are thought to be from "smoking and drinking." The pt's daughter notes that the pt previously had a concern for excessive alcohol use. The pt had a stroke in the past which her daughter notes affected the patient's memory initially. She has thus far been making medical decisions for herself.  The pt is currently living in [place], [place] but is trying to move to [place] where her daughter lives. She has been seeing a PCP, Neurology, and Cardiology in [place], and is not sure which clinic or doctors she has seen specifically. She has not established care with these specialties in [place] yet.  The pt notes that she has had weight loss recently, but is unsure how much. The pt denies abdominal pains.   Most recent lab results (08/17/18) of CBC w/diff and BMP is as follows: all values are WNL except for WBC at 43.2k, HGB at 10.5, HCT at 35.8, MCV at 74.9, MCH at 22.0, MCHC at 29.3, RDW at 23.2, nRBC at 0.8%, ANC at 29.8k, Lymphs abs at 4.3k, Monocytes abs at 1.7k, Eosinophils abs at 900, Basophils abs at 2.6k, Abs immature granulocytes at 3.9k, Glucose at  102, Creatinine at 1.10, Calcium at 8.3, GFR at 59.  On review of systems, pt reports good energy levels, and denies abdominal pains, and any other symptoms.   On PMHx the pt reports stroke, CHF. On Social Hx the pt reports previous cigarette smoking and excessive alcohol consumption   MEDICAL HISTORY:  Past Medical History:  Diagnosis Date  . CAD (coronary artery disease)   . Cardiomyopathy, secondary ([place])   . Central hearing loss   . CHF (congestive heart failure) ([place])   . Essential hypertension   . History of stroke   . Menopausal symptoms   . Mixed hyperlipidemia   . Stroke [facility])     SURGICAL HISTORY: Past Surgical History:  Procedure Laterality Date  . ATRIAL CARDIAC PACEMAKER INSERTION    . TUBAL LIGATION      SOCIAL HISTORY: Social History   Socioeconomic History  . Marital status: Single    Spouse name: Not on file  . Number of children: 2  . Years of education: Not on file  . Highest education level: Not on file  Occupational History  . Not on file  Social Needs  . Financial resource strain: Not on file  . Food insecurity:    Worry: Not on file    Inability: Not on file  . Transportation needs:    Medical: Not on file    Non-medical: Not on file  Tobacco Use  . Smoking status: Current  Every Day Smoker    Packs/day: 0.25  . Smokeless tobacco: Never Used  Substance and Sexual Activity  . Alcohol use: Yes    Comment: occasionally  . Drug use: Not Currently  . Sexual activity: Not on file  Lifestyle  . Physical activity:    Days per week: Not on file    Minutes per session: Not on file  . Stress: Not on file  Relationships  . Social connections:    Talks on phone: Not on file    Gets together: Not on file    Attends religious service: Not on file    Active member of club or organization: Not on file    Attends meetings of clubs or organizations: Not on file    Relationship status: Not on file  . Intimate partner violence:    Fear of  current or ex partner: Not on file    Emotionally abused: Not on file    Physically abused: Not on file    Forced sexual activity: Not on file  Other Topics Concern  . Not on file  Social History Narrative  . Not on file    FAMILY HISTORY: Family History  Problem Relation Age of Onset  . Hypertension Mother   . Diabetes Father   . Hypertension Father   . Heart disease Father     ALLERGIES:  has No Known Allergies.  MEDICATIONS:  Current Outpatient Medications  Medication Sig Dispense Refill  . acetaminophen (TYLENOL) 500 MG tablet Take 1,000 mg by mouth every 6 (six) hours as needed for mild pain.    . carvedilol (COREG) 25 MG tablet Take 25 mg by mouth 2 (two) times daily with a meal.    . furosemide (LASIX) 40 MG tablet Take 1 tablet (40 mg total) by mouth 2 (two) times daily. 60 tablet 0  . hydrALAZINE (APRESOLINE) 25 MG tablet Take 25 mg by mouth 3 (three) times daily.    Marland Kitchen lisinopril (PRINIVIL,ZESTRIL) 40 MG tablet Take 1 tablet (40 mg total) by mouth daily. 30 tablet 0  . spironolactone (ALDACTONE) 25 MG tablet Take 1 tablet (25 mg total) by mouth daily for 30 days. 30 tablet 0  . warfarin (COUMADIN) 5 MG tablet Take 5 mg tablets (1 pill) every Monday and Thursday, take 7.5 mg tablets (1.5 pills) every other day. 26 tablet 0   No current facility-administered medications for this visit.     REVIEW OF SYSTEMS:    10 Point review of Systems was done is negative except as noted above.  PHYSICAL EXAMINATION: ECOG PERFORMANCE STATUS: 2 - Symptomatic, <50% confined to bed  . Vitals:   08/24/18 1252  BP: 136/73  Pulse: 68  Resp: 17  Temp: 99 F (37.2 C)  SpO2: 96%   Filed Weights   08/24/18 1252  Weight: 178 lb 8 oz (81 kg)   .Body mass index is 32.65 kg/m.  GENERAL:alert, in no acute distress and comfortable SKIN: no acute rashes, no significant lesions EYES: conjunctiva are pink and non-injected, sclera anicteric OROPHARYNX: MMM, no exudates, no  oropharyngeal erythema or ulceration NECK: supple, no JVD LYMPH:  no palpable lymphadenopathy in the cervical, axillary or inguinal regions LUNGS: clear to auscultation b/l with normal respiratory effort HEART: regular rate & rhythm ABDOMEN:  normoactive bowel sounds , non tender, not distended. Palpable splenomegaly about 2 fingerbreadths beneath left costal margin Extremity: no pedal edema PSYCH: alert & oriented x 3 with fluent speech NEURO: HOH, some mild cognitive issues,  no focal motor/sensory deficits  LABORATORY DATA:  I have reviewed the data as listed  . CBC Latest Ref Rng & Units 08/17/2018 08/16/2018 08/16/2018  WBC 4.0 - 10.5 K/uL 43.2(H) - 48.6(H)  Hemoglobin 12.0 - 15.0 g/dL 10.5(L) 12.2 9.7(L)  Hematocrit 36.0 - 46.0 % 35.8(L) 36.0 33.8(L)  Platelets 150 - 400 K/uL 229 - 287    . CMP Latest Ref Rng & Units 08/17/2018 08/16/2018 08/16/2018  Glucose 70 - 99 mg/dL 102(H) - 123(H)  BUN 6 - 20 mg/dL 11 - 11  Creatinine 0.44 - 1.00 mg/dL 1.10(H) - 1.19(H)  Sodium 135 - 145 mmol/L 138 140 138  Potassium 3.5 - 5.1 mmol/L 4.0 3.7 4.2  Chloride 98 - 111 mmol/L 102 - 104  CO2 22 - 32 mmol/L 23 - 22  Calcium 8.9 - 10.3 mg/dL 8.3(L) - 8.7(L)  Total Protein 6.5 - 8.1 g/dL - - 6.4(L)  Total Bilirubin 0.3 - 1.2 mg/dL - - 1.2  Alkaline Phos 38 - 126 U/L - - 64  AST 15 - 41 U/L - - 31  ALT 0 - 44 U/L - - 17    07/14/18 BCR-ABL:     RADIOGRAPHIC STUDIES: I have personally reviewed the radiological images as listed and agreed with the findings in the report. Dg Chest 2 View  Result Date: 08/16/2018 CLINICAL DATA:  Shortness of breath EXAM: CHEST - 2 VIEW COMPARISON:  07/14/2018 FINDINGS: Cardiomegaly. Mild vascular congestion. There is no edema, consolidation, effusion, or pneumothorax. No acute osseous finding IMPRESSION: Cardiomegaly and vascular congestion. Electronically Signed   By: Monte Fantasia M.D.   On: 08/16/2018 06:24    ASSESSMENT & PLAN:   48 y.o. female with  1.  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 ANC at 29.8k, Lymphs at 4.3k, Monocytes at 1.7k, Eosinophils at 900, Basophils ab 2.6k and Immature grans at 3.9k -Discussed the 07/14/18 BCR-ABL test which revealed b2a2 at 16.7455%, b3a2 at 13.1258% and E1A2 at 0.0157% -Reviewed the pt's previous labs: WBC have been increasing since earliest available labs in August 2019, with increasing basophilia and anemia -Discussed with the pt and her daughter that the pattern of the patient's blood counts, in addition to her BCR-ABL results, are concerning for Chronic Myeloid Leukemia but need to be confirmed with a BM Bx -Provided supplemental information about a BM Bx procedure and discussed the necessity for this with the pt and her daughter -Strongly recommended to the pt and her daughter that the pt obtain a local PCP and a Cardiologist urgently -Placed a referral to Education officer, museum for assistance in coordinating cares given multiple barriers to care, help with setting up daughter as Animator which patient prefers. -Will order blood tests today as noted below -Will order CT guided bone marrow Bx  -Will see the pt back in 2 weeks  2.  Patient Active Problem List   Diagnosis Date Noted  . CML (chronic myelocytic leukemia) ([place]) 08/24/2018  . Left ventricular thrombus without MI 08/16/2018  . Leukocytosis 08/16/2018  . CHF (congestive heart failure) ([place]) 07/14/2018  . Coronary artery disease 07/14/2018  . Odontogenic infection 07/14/2018  . Essential (primary) hypertension 09/20/2017  . History of stroke 09/20/2017  - setup local PCP and cardiologist.   -SW consultation for multiple barriers to treatment -Patient to immediately setup a PCP and local cardiologist since she  has moved to [place] -labs today -CT bone marrow biopsy in 5 days  RTC with Dr Irene Limbo in 2  weeks with labs  . Orders Placed This Encounter  Procedures  . CT BONE MARROW BIOPSY & ASPIRATION    Standing Status:   Future    Standing Expiration Date:   11/23/2019    Order Specific Question:   Reason for Exam (SYMPTOM  OR DIAGNOSIS REQUIRED)    Answer:   Progressive leucocytosis of 40-50k. Likely CML. Unilateral bone marrow aspiration and biopsy    Order Specific Question:   Is patient pregnant?    Answer:   No    Order Specific Question:   Preferred location?    Answer:   [facility]    Order Specific Question:   Radiology Contrast Protocol - do NOT remove file path    Answer:   \\[email or web address]  . CT Biopsy    Standing Status:   Future    Standing Expiration Date:   08/24/2019    Order Specific Question:   Lab orders requested (DO NOT place separate lab orders, these will be automatically ordered during procedure specimen collection):    Answer:   Surgical Pathology    Comments:   flow cytometry, molecular studies as needed    Order Specific Question:   Reason for Exam (SYMPTOM  OR DIAGNOSIS REQUIRED)    Answer:   Progressive leucocytosis of 40-50k. Likely CML.    Order Specific Question:   Is patient pregnant?    Answer:   No    Order Specific Question:   Preferred location?    Answer:   [facility]    Order Specific Question:   Radiology Contrast Protocol - do NOT remove file path    Answer:   \\[email or web address]  . CBC with Differential/Platelet    Standing Status:   Future    Number of Occurrences:   1    Standing Expiration Date:   09/28/2019  . CMP ([place] only)    Standing Status:   Future    Number of Occurrences:   1    Standing Expiration Date:   08/24/2019  . Lactate dehydrogenase    Standing Status:   Future    Number of Occurrences:   1    Standing Expiration Date:   08/24/2019  . BCR-ABL    Standing Status:   Future    Number of Occurrences:   1    Standing Expiration Date:    09/28/2019  . Ambulatory referral to Social Work    Referral Priority:   Urgent    Referral Type:   Consultation    Referral Reason:   Specialty Services Required    Number of Visits Requested:   1  . Sample to Blood Bank    Standing Status:   Future    Number of Occurrences:   1    Standing Expiration Date:   08/24/2019     All of the patients questions were answered with apparent satisfaction. The patient knows to call the clinic with any problems, questions or concerns.  The total time spent in the appt was 60 minutes and more than 50% was on counseling and direct patient cares.    Sullivan Lone MD [place] AAHIVMS [facility] Hematology/Oncology Physician [facility]  (Office):       [phone] (Work cell):  [phone] (Fax):           [phone]  08/24/2018 1:45 PM  I, Baldwin [place], am acting as a scribe for Dr. Sullivan Lone.   .I have reviewed the above documentation for accuracy and completeness, and I agree with the above. Brunetta Genera MD

## 2018-08-24 NOTE — Telephone Encounter (Signed)
Scheduled appt per 4/16 los.  Social worker will contact patient in regards to their appt.

## 2018-08-24 NOTE — Assessment & Plan Note (Signed)
Essential hypertension that is well controlled at this time  Continue current medication profile

## 2018-08-24 NOTE — Assessment & Plan Note (Signed)
Acute on chronic systolic heart failure now compensated to chronic phase.  Ejection fraction 20 to 25%.  Secondary to severe longstanding hypertension.  Plan will be to continue lisinopril 40 mg daily, Apresoline 25 mg 3 times daily, furosemide 40 mg twice daily, Coreg at all 25 mg twice daily.  Note potassium level is adequate therefore will not need potassium replacements  We will make a referral to the heart failure clinic for this patient

## 2018-08-29 ENCOUNTER — Encounter: Payer: Self-pay | Admitting: General Practice

## 2018-08-29 NOTE — Progress Notes (Signed)
[facility] CSW Progress Notes  Referral received from MD to address barriers to care w patient - she is moving from [place] to [place] and needs to establish w PCP, cardiology and neurology.  Although specialty referrals are not done by social work, CSW reviewed chart and noted that patient had appt w [facility] (Dr Joya Gaskins).  MEssage sent to Eden Lathe, RN CM at this clinic to determine is she can be linked w PCP and any needed specialty referrals.  CSW was also requested to assist w HCPOA - at present time, these documents cannot be witnessed which is a legal requirement.  Patient can be mailed copy of HCPOA document and instructed to contact CSW after visitors and volunteers are allowed into [facility] again.  CSW attempted to contact patient; however, there was no answer nor any VM.  MD notified of the above.  Edwyna Shell, LCSW Clinical Social Worker Phone:  [phone]

## 2018-08-29 NOTE — Progress Notes (Signed)
[facility] CSW Progress Note  RN CM Eden Lathe at [facility] is aware that pt desires PCP appt - at last visit there, recommendation was to schedule next visit in approx one month.  Facility will coordinate primary care needs for patient.  Edwyna Shell, LCSW Clinical Social Worker Phone:  [phone]

## 2018-08-31 ENCOUNTER — Telehealth: Payer: Self-pay

## 2018-08-31 ENCOUNTER — Ambulatory Visit: Payer: Medicare Other | Attending: Family Medicine | Admitting: Pharmacist

## 2018-08-31 ENCOUNTER — Other Ambulatory Visit: Payer: Self-pay

## 2018-08-31 ENCOUNTER — Encounter: Payer: Self-pay | Admitting: General Practice

## 2018-08-31 DIAGNOSIS — I513 Intracardiac thrombosis, not elsewhere classified: Secondary | ICD-10-CM

## 2018-08-31 DIAGNOSIS — Z7901 Long term (current) use of anticoagulants: Secondary | ICD-10-CM | POA: Diagnosis present

## 2018-08-31 DIAGNOSIS — I240 Acute coronary thrombosis not resulting in myocardial infarction: Secondary | ICD-10-CM

## 2018-08-31 LAB — POCT INR: INR: 2 (ref 2.0–3.0)

## 2018-08-31 NOTE — Progress Notes (Signed)
[facility] CSW Progress Notes  Request from MD to help patient get connected to PCP services as well as specialty services for cardiology and neurology.  Per record, pt has appt at [facility] (PCP) - have spoken w RN CM Eden Lathe at this practice.  Both of [place] have called patient and been unable to leave a VM or reach patient. As patient is living w daughter in [place], [place] has also left a VM for daughter Jeanella Anton asking for best way to reach patient.  Awaiting return call.  Edwyna Shell, LCSW Clinical Social Worker Phone:  [phone]

## 2018-08-31 NOTE — Telephone Encounter (Signed)
At request of Edwyna Shell, LCSW attempted to contact patient to schedule an appointment to establish care with PCP. Call placed to # [phone] x2 and both times the message stated that voicemail has not been set up.

## 2018-08-31 NOTE — Progress Notes (Signed)
    Pharmacy Anticoagulation Clinic  Subjective: Patient presents today for INR monitoring/new Coumadin patient. Anticoagulation indication is left ventricular apical thrombus without MI, hx of stroke likely emoblic. She has been on Coumadin for many years.   Current dose of warfarin: 7.5 mg every day with 5 mg on Mondays and Thursdays.  Adherence to warfarin: confirms Signs/symptoms of bleeding: denies  Recent changes in diet: denies  Recent changes in medications: denies  Upcoming procedures that may impact anticoagulation: none  Objective: Today's INR = 2.0  Lab Results  Component Value Date   INR 2.0 08/31/2018   INR 2.1 (H) 08/18/2018   INR 3.1 (H) 08/17/2018     Assessment and Plan: Anticoagulation: Patient is Therapeutic based on patient's INR of 2.0 and patient's INR goal of 2-3. Will continue current warfarin dose as defined above.   Patient verbalized understanding and was provided with written instructions. Next INR check planned for 2 weeks. If therapeutic then, we can space out monthly.   Benard Halsted, PharmD, [place] [phone]

## 2018-09-01 ENCOUNTER — Encounter: Payer: Self-pay | Admitting: General Practice

## 2018-09-01 ENCOUNTER — Other Ambulatory Visit: Payer: Self-pay | Admitting: Radiology

## 2018-09-01 NOTE — Progress Notes (Signed)
[facility] CSW Progress Notes  Call to patient to offer help w HCPOA and getting needed resources to transfer care to [place] area.  No answer/VM on patient phone. Left another VM on daughter Lucretia's phone.  Edwyna Shell, LCSW Clinical Social Worker Phone:  [phone]

## 2018-09-04 ENCOUNTER — Ambulatory Visit (HOSPITAL_COMMUNITY): Payer: Medicare Other

## 2018-09-04 ENCOUNTER — Other Ambulatory Visit: Payer: Self-pay

## 2018-09-04 ENCOUNTER — Ambulatory Visit (HOSPITAL_COMMUNITY)
Admission: RE | Admit: 2018-09-04 | Discharge: 2018-09-04 | Disposition: A | Discharge status: Home / Self Care | Payer: Medicare Other | Source: Ambulatory Visit | Attending: Hematology | Admitting: Hematology

## 2018-09-04 ENCOUNTER — Encounter (HOSPITAL_COMMUNITY): Payer: Self-pay

## 2018-09-04 DIAGNOSIS — Z7901 Long term (current) use of anticoagulants: Secondary | ICD-10-CM | POA: Diagnosis not present

## 2018-09-04 DIAGNOSIS — I501 Left ventricular failure: Secondary | ICD-10-CM | POA: Insufficient documentation

## 2018-09-04 DIAGNOSIS — C9210 Chronic myeloid leukemia, BCR/ABL-positive, not having achieved remission: Secondary | ICD-10-CM | POA: Insufficient documentation

## 2018-09-04 DIAGNOSIS — Z8673 Personal history of transient ischemic attack (TIA), and cerebral infarction without residual deficits: Secondary | ICD-10-CM | POA: Diagnosis not present

## 2018-09-04 DIAGNOSIS — I2510 Atherosclerotic heart disease of native coronary artery without angina pectoris: Secondary | ICD-10-CM | POA: Diagnosis not present

## 2018-09-04 DIAGNOSIS — Z950 Presence of cardiac pacemaker: Secondary | ICD-10-CM | POA: Diagnosis not present

## 2018-09-04 DIAGNOSIS — F17200 Nicotine dependence, unspecified, uncomplicated: Secondary | ICD-10-CM | POA: Diagnosis not present

## 2018-09-04 DIAGNOSIS — Z8249 Family history of ischemic heart disease and other diseases of the circulatory system: Secondary | ICD-10-CM | POA: Insufficient documentation

## 2018-09-04 DIAGNOSIS — Z833 Family history of diabetes mellitus: Secondary | ICD-10-CM | POA: Diagnosis not present

## 2018-09-04 DIAGNOSIS — I429 Cardiomyopathy, unspecified: Secondary | ICD-10-CM | POA: Diagnosis not present

## 2018-09-04 DIAGNOSIS — I110 Hypertensive heart disease with heart failure: Secondary | ICD-10-CM | POA: Insufficient documentation

## 2018-09-04 DIAGNOSIS — Z79899 Other long term (current) drug therapy: Secondary | ICD-10-CM | POA: Insufficient documentation

## 2018-09-04 LAB — CBC WITH DIFFERENTIAL/PLATELET
Abs Immature Granulocytes: 7.5 10*3/uL — ABNORMAL HIGH (ref 0.00–0.07)
Band Neutrophils: 25 %
Basophils Absolute: 0.6 10*3/uL — ABNORMAL HIGH (ref 0.0–0.1)
Basophils Relative: 1 %
Blasts: 1 %
Eosinophils Absolute: 0.6 10*3/uL — ABNORMAL HIGH (ref 0.0–0.5)
Eosinophils Relative: 1 %
HCT: 45.2 % (ref 36.0–46.0)
Hemoglobin: 12.9 g/dL (ref 12.0–15.0)
Lymphocytes Relative: 6 %
Lymphs Abs: 3.8 10*3/uL (ref 0.7–4.0)
MCH: 21.9 pg — ABNORMAL LOW (ref 26.0–34.0)
MCHC: 28.5 g/dL — ABNORMAL LOW (ref 30.0–36.0)
MCV: 76.6 fL — ABNORMAL LOW (ref 80.0–100.0)
Metamyelocytes Relative: 6 %
Monocytes Absolute: 3.1 10*3/uL — ABNORMAL HIGH (ref 0.1–1.0)
Monocytes Relative: 5 %
Myelocytes: 6 %
Neutro Abs: 46.3 10*3/uL — ABNORMAL HIGH (ref 1.7–7.7)
Neutrophils Relative %: 49 %
Platelets: 253 10*3/uL (ref 150–400)
RBC: 5.9 MIL/uL — ABNORMAL HIGH (ref 3.87–5.11)
RDW: 24.1 % — ABNORMAL HIGH (ref 11.5–15.5)
WBC: 62.5 10*3/uL (ref 4.0–10.5)
nRBC: 0.1 % (ref 0.0–0.2)

## 2018-09-04 LAB — PROTIME-INR
INR: 1.7 — ABNORMAL HIGH (ref 0.8–1.2)
Prothrombin Time: 19.4 seconds — ABNORMAL HIGH (ref 11.4–15.2)

## 2018-09-04 MED ORDER — FENTANYL CITRATE (PF) 100 MCG/2ML IJ SOLN
INTRAMUSCULAR | Status: AC
  Filled 2018-09-04: qty 2

## 2018-09-04 MED ORDER — SODIUM CHLORIDE 0.9 % IV SOLN
INTRAVENOUS | Status: DC
  Administered 2018-09-04: 10:00:00 via INTRAVENOUS

## 2018-09-04 MED ORDER — MIDAZOLAM HCL 2 MG/2ML IJ SOLN
INTRAMUSCULAR | Status: AC
  Filled 2018-09-04: qty 4

## 2018-09-04 MED ORDER — FENTANYL CITRATE (PF) 100 MCG/2ML IJ SOLN
INTRAMUSCULAR | Status: DC | PRN
  Administered 2018-09-04 (×2): 50 ug via INTRAVENOUS

## 2018-09-04 MED ORDER — MIDAZOLAM HCL 2 MG/2ML IJ SOLN
INTRAMUSCULAR | Status: DC | PRN
  Administered 2018-09-04 (×2): 1 mg via INTRAVENOUS

## 2018-09-04 MED ORDER — LIDOCAINE HCL (PF) 1 % IJ SOLN
INTRAMUSCULAR | Status: DC | PRN
  Administered 2018-09-04: 10 mL

## 2018-09-04 NOTE — Progress Notes (Signed)
CRITICAL VALUE ALERT  Critical Value:  62.5 wbc  Date & Time Notied:  1848 09/04/18  Provider Notified: already saw pt.  Pt is here due to high wbc  Orders Received/Actions taken: no new action

## 2018-09-04 NOTE — Discharge Instructions (Signed)
Leave the dressing on for 24 hours, may remove at 12:00 4/28 Tuesday.  Then you may shower after removal.  You do not have to place another dressing over the site.    Bone Marrow Aspiration and Bone Marrow Biopsy, Adult, Care After This sheet gives you information about how to care for yourself after your procedure. Your health care provider may also give you more specific instructions. If you have problems or questions, contact your health care provider. What can I expect after the procedure? After the procedure, it is common to have:  Mild pain and tenderness.  Swelling.  Bruising. Follow these instructions at home: Puncture site care      Follow instructions from your health care provider about how to take care of the puncture site. Make sure you: ? Wash your hands with soap and water before you change your bandage (dressing). If soap and water are not available, use hand sanitizer. ? Change your dressing as told by your health care provider.  Check your puncture siteevery day for signs of infection. Check for: ? More redness, swelling, or pain. ? More fluid or blood. ? Warmth. ? Pus or a bad smell. General instructions  Take over-the-counter and prescription medicines only as told by your health care provider.  Do not take baths, swim, or use a hot tub until your health care provider approves. Ask if you can take a shower or have a sponge bath.  Return to your normal activities as told by your health care provider. Ask your health care provider what activities are safe for you.  Do not drive for 24 hours if you were given a medicine to help you relax (sedative) during your procedure.  Keep all follow-up visits as told by your health care provider. This is important. Contact a health care provider if:  Your pain is not controlled with medicine. Get help right away if:  You have a fever.  You have more redness, swelling, or pain around the puncture site.  You have  more fluid or blood coming from the puncture site.  Your puncture site feels warm to the touch.  You have pus or a bad smell coming from the puncture site. These symptoms may represent a serious problem that is an emergency. Do not wait to see if the symptoms will go away. Get medical help right away. Call your local emergency services (911 in the [place] Do not drive yourself to the hospital. Summary  After the procedure, it is common to have mild pain, tenderness, swelling, and bruising.  Follow instructions from your health care provider about how to take care of the puncture site.  Get help right away if you have any symptoms of infection or if you have more blood or fluid coming from the puncture site. This information is not intended to replace advice given to you by your health care provider. Make sure you discuss any questions you have with your health care provider. Document Released: 11/13/2004 Document Revised: 08/09/2017 Document Reviewed: 10/08/2015 Elsevier Interactive Patient Education  2019 [employer].  Moderate Conscious Sedation, Adult, Care After These instructions provide you with information about caring for yourself after your procedure. Your health care provider may also give you more specific instructions. Your treatment has been planned according to current medical practices, but problems sometimes occur. Call your health care provider if you have any problems or questions after your procedure. What can I expect after the procedure? After your procedure, it is common:  To feel sleepy for several hours.  To feel clumsy and have poor balance for several hours.  To have poor judgment for several hours.  To vomit if you eat too soon. Follow these instructions at home: For at least 24 hours after the procedure:   Do not: ? Participate in activities where you could fall or become injured. ? Drive. ? Use heavy machinery. ? Drink alcohol. ? Take sleeping pills  or medicines that cause drowsiness. ? Make important decisions or sign legal documents. ? Take care of children on your own.  Rest. Eating and drinking  Follow the diet recommended by your health care provider.  If you vomit: ? Drink water, juice, or soup when you can drink without vomiting. ? Make sure you have little or no nausea before eating solid foods. General instructions  Have a responsible adult stay with you until you are awake and alert.  Take over-the-counter and prescription medicines only as told by your health care provider.  If you smoke, do not smoke without supervision.  Keep all follow-up visits as told by your health care provider. This is important. Contact a health care provider if:  You keep feeling nauseous or you keep vomiting.  You feel light-headed.  You develop a rash.  You have a fever. Get help right away if:  You have trouble breathing. This information is not intended to replace advice given to you by your health care provider. Make sure you discuss any questions you have with your health care provider. Document Released: 02/14/2013 Document Revised: 09/29/2015 Document Reviewed: 08/16/2015 Elsevier Interactive Patient Education  2019 [employer].

## 2018-09-04 NOTE — Progress Notes (Signed)
Spoke with pt's daughter, Julien Girt, and confirmed she was pt's ride home.  Gave her an estimate on d/c time.  Informed her we would call her back when pt's procedure was complete with a more accurate d/c time and also provide d/c instructions.  Daughter voiced understanding.

## 2018-09-04 NOTE — Consult Note (Signed)
Chief Complaint: Patient was seen in consultation today for CT guided bone marrow biopsy  Referring Physician(s): Brunetta Genera  Supervising Physician: Jacqulynn Cadet  Patient Status: [facility] - Out-pt  History of Present Illness: Charlotte Wyatt is a 48 y.o. female with history of coronary artery disease, cardiomyopathy, left ventricular thrombus (on coumadin), CHF, prior stroke, hyperlipidemia as well as progressive leukocytosis/anemia and basophilia.  She has a positive BCR-ABL and presents today for CT-guided bone marrow biopsy for further evaluation/rule out CML.  Past Medical History:  Diagnosis Date  . CAD (coronary artery disease)   . Cardiomyopathy, secondary ([place])   . Central hearing loss   . CHF (congestive heart failure) ([place])   . Essential hypertension   . History of stroke   . Menopausal symptoms   . Mixed hyperlipidemia   . Stroke [facility])     Past Surgical History:  Procedure Laterality Date  . ATRIAL CARDIAC PACEMAKER INSERTION    . TUBAL LIGATION      Allergies: Patient has no known allergies.  Medications: Prior to Admission medications   Medication Sig Start Date End Date Taking? Authorizing Provider  acetaminophen (TYLENOL) 500 MG tablet Take 1,000 mg by mouth every 6 (six) hours as needed for mild pain.    [provider]  carvedilol (COREG) 25 MG tablet Take 1 tablet (25 mg total) by mouth 2 (two) times daily with a meal. 08/24/18   Elsie Stain, MD  furosemide (LASIX) 40 MG tablet Take 1 tablet (40 mg total) by mouth 2 (two) times daily. 08/24/18   Elsie Stain, MD  hydrALAZINE (APRESOLINE) 25 MG tablet Take 1 tablet (25 mg total) by mouth 3 (three) times daily. 08/24/18   Elsie Stain, MD  lisinopril (ZESTRIL) 40 MG tablet Take 1 tablet (40 mg total) by mouth daily. 08/24/18   Elsie Stain, MD  warfarin (COUMADIN) 5 MG tablet Take 5 mg tablets (1 pill) every Monday and Thursday, take 7.5 mg tablets (1.5 pills) every  other day. 08/24/18   Elsie Stain, MD     Family History  Problem Relation Age of Onset  . Hypertension Mother   . Diabetes Father   . Hypertension Father   . Heart disease Father     Social History   Socioeconomic History  . Marital status: Single    Spouse name: Not on file  . Number of children: 2  . Years of education: Not on file  . Highest education level: Not on file  Occupational History  . Not on file  Social Needs  . Financial resource strain: Not on file  . Food insecurity:    Worry: Not on file    Inability: Not on file  . Transportation needs:    Medical: Not on file    Non-medical: Not on file  Tobacco Use  . Smoking status: Current Every Day Smoker    Packs/day: 0.25  . Smokeless tobacco: Never Used  Substance and Sexual Activity  . Alcohol use: Yes    Comment: occasionally  . Drug use: Not Currently  . Sexual activity: Not on file  Lifestyle  . Physical activity:    Days per week: Not on file    Minutes per session: Not on file  . Stress: Not on file  Relationships  . Social connections:    Talks on phone: Not on file    Gets together: Not on file    Attends religious service: Not on file  Active member of club or organization: Not on file    Attends meetings of clubs or organizations: Not on file    Relationship status: Not on file  Other Topics Concern  . Not on file  Social History Narrative  . Not on file      Review of Systems currently denies fever, headache, chest pain, dyspnea, cough, abdominal pain, nausea, vomiting or bleeding.  She is hard of hearing and does have some intermittent back pain.  Vital Signs: BP (!) 150/87 (BP Location: Right Arm)   Pulse 71   Temp 98.1 F (36.7 C) (Oral)   Resp 18   SpO2 90%   Physical Exam :awake ,alert.  Chest with some diminished breath sounds left base, right clear.  Heart with regular rate and rhythm.  Abdomen soft, positive bowel sounds, nontender.  No significant lower  extremity edema.  Imaging: Dg Chest 2 View  Result Date: 08/16/2018 CLINICAL DATA:  Shortness of breath EXAM: CHEST - 2 VIEW COMPARISON:  07/14/2018 FINDINGS: Cardiomegaly. Mild vascular congestion. There is no edema, consolidation, effusion, or pneumothorax. No acute osseous finding IMPRESSION: Cardiomegaly and vascular congestion. Electronically Signed   By: Monte Fantasia M.D.   On: 08/16/2018 06:24    Labs:  CBC: Recent Labs    07/16/18 0937 08/16/18 0531 08/16/18 0726 08/17/18 0502 08/24/18 1427  WBC 37.2* 48.6*  --  43.2* 69.0*  HGB 10.3* 9.7* 12.2 10.5* 13.4  HCT 34.0* 33.8* 36.0 35.8* 46.0  PLT 391 287  --  229 452*    COAGS: Recent Labs    08/16/18 0628 08/17/18 0502 08/18/18 0429 08/31/18 0916  INR 4.2* 3.1* 2.1* 2.0    BMP: Recent Labs    07/17/18 0552 08/16/18 0531 08/16/18 0726 08/17/18 0502 08/24/18 1427  NA 138 138 140 138 140  K 4.3 4.2 3.7 4.0 4.7  CL 98 104  --  102 101  CO2 30 22  --  23 28  GLUCOSE 96 123*  --  102* 72  BUN 18 11  --  11 22*  CALCIUM 8.6* 8.7*  --  8.3* 9.5  CREATININE 1.10* 1.19*  --  1.10* 0.94  GFRNONAA 59* 54*  --  59* >60  GFRAA >60 >60  --  >60 >60    LIVER FUNCTION TESTS: Recent Labs    01/05/18 1801 07/14/18 0848 08/16/18 0531 08/24/18 1427  BILITOT 0.5 0.7 1.2 0.4  AST _0 ALT _1 ALKPHOS 56 60 64 91  PROT 6.9 6.4* 6.4* 8.1  ALBUMIN 3.7 3.2* 3.1* 3.5    TUMOR MARKERS: No results for input(s): AFPTM, CEA, CA199, CHROMGRNA in the last 8760 hours.  Assessment and Plan: 48 y.o. female with history of coronary artery disease, cardiomyopathy, left ventricular thrombus (on coumadin), CHF, prior stroke, hyperlipidemia as well as progressive leukocytosis/anemia and basophilia.  She has a positive BCR-ABL and presents today for CT-guided bone marrow biopsy for further evaluation/rule out CML.Risks and benefits of procedure was discussed with the patient and/or patient's family including, but  not limited to bleeding, infection, damage to adjacent structures or low yield requiring additional tests.  All of the questions were answered and there is agreement to proceed.  Consent signed and in chart.     Thank you for this interesting consult.  I greatly enjoyed meeting Charlotte Wyatt and look forward to participating in their care.  A copy of this report was sent to the requesting provider on  this date.  Electronically Signed: D. Rowe Robert, PA-C 09/04/2018, 9:32 AM   I spent a total of 20 minutes    in face to face in clinical consultation, greater than 50% of which was counseling/coordinating care for CT guided bone marrow biopsy

## 2018-09-04 NOTE — Procedures (Signed)
Interventional Radiology Procedure Note  Procedure: CT guided aspirate and core biopsy of right iliac bone Complications: None Recommendations: - Bedrest supine x 1 hrs - Hydrocodone PRN  Pain - Follow biopsy results  Signed,  Heath K. McCullough, MD   

## 2018-09-06 ENCOUNTER — Other Ambulatory Visit: Payer: Self-pay | Admitting: *Deleted

## 2018-09-06 DIAGNOSIS — C9210 Chronic myeloid leukemia, BCR/ABL-positive, not having achieved remission: Secondary | ICD-10-CM

## 2018-09-06 LAB — BCR/ABL

## 2018-09-06 NOTE — Progress Notes (Signed)
HEMATOLOGY/ONCOLOGY CONSULTATION NOTE  Date of Service: 09/07/2018  Patient Care Team: System, Pcp Not In as PCP - General   Dr. 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 female who has been referred to [place] by Dr. Lonia Skinner for evaluation and management of her Leukocytosis. She is accompanied today by her daughter via cell phone. The pt reports that she is doing well overall.  Prior to today's visit, the pt was admitted in both early March and early April for her CHF. At both times, she was seen to have leukocytosis and a BCR-ABL was collected which was positive. She was then referred to see me.  The pt reports that she has "been good," in the last 6 months except when she has had fluid overload issues. The pt notes that her cardiac concerns are thought to be from "smoking and drinking." The pt's daughter notes that the pt previously had a concern for excessive alcohol use. The pt had a stroke in the past which her daughter notes affected the patient's memory initially. She has thus far been making medical decisions for herself.  The pt is currently living in [place], [place] but is trying to move to [place] where her daughter lives. She has been seeing a PCP, Neurology, and Cardiology in [place], and is not sure which clinic or doctors she has seen specifically. She has not established care with these specialties in [place] yet.  The pt notes that she has had weight loss recently, but is unsure how much. The pt denies abdominal pains.   Most recent lab results (08/17/18) of CBC w/diff and BMP is as follows: all values are WNL except for WBC at 43.2k, HGB at 10.5, HCT at 35.8, MCV at 74.9, MCH at 22.0, MCHC at 29.3, RDW at 23.2, nRBC at 0.8%, ANC at 29.8k, Lymphs abs at 4.3k, Monocytes abs at 1.7k, Eosinophils abs at 900, Basophils abs at 2.6k, Abs immature  granulocytes at 3.9k, Glucose at 102, Creatinine at 1.10, Calcium at 8.3, GFR at 59.  On review of systems, pt reports good energy levels, and denies abdominal pains, and any other symptoms.   On PMHx the pt reports stroke, CHF. 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 The patient's last visit with [place] was on 08/24/18. The pt is accompanied today by her daughter, Charlotte Wyatt, via [employer]. The pt reports that she is doing well overall.  The pt reports that she is that she feels a little sore from the recent bone marrow biopsy. She also notes that she has a little tenderness in her sinuses today. The pt denies difficulty breathing, abdominal pain, fevers or chills. She denies leg swelling.   She enjoys sowing and crocheting and designed the shirt she is wearing today. She notes that she enjoys walking with her niece.  The pt's daughter notes that the pt has established care with a PCP, Dr. Asencion Noble.  Lab results today (09/07/18) of CBC w/diff and CMP is as follows: all values are WNL except for WBC at 68.9k, RBC at 6.01, MCV at 75.5, MCH at 22.1, MCHC at 29.3, RDW at 24.7, Total Protein at 8.2, AST at 42. 09/07/18 LDH is pending  On review of systems, pt reports some  soreness in her hip, stable energy levels, staying active, and denies difficulty breathing, abdominal pains, fevers, chills, leg swelling, and any other symptoms.   MEDICAL HISTORY:  Past Medical History:  Diagnosis Date   CAD (coronary artery disease)    Cardiomyopathy, secondary ([place])    Central hearing loss    CHF (congestive heart failure) ([facility])    Essential hypertension    History of stroke    Menopausal symptoms    Mixed hyperlipidemia    Stroke ([place])     SURGICAL HISTORY: Past Surgical History:  Procedure Laterality Date   ATRIAL  CARDIAC PACEMAKER INSERTION     TUBAL LIGATION      SOCIAL HISTORY: Social History   Socioeconomic History   Marital status: Single    Spouse name: Not on file   Number of children: 2   Years of education: Not on file   Highest education level: Not on file  Occupational History   Not on file  Social Needs   Financial resource strain: Not on file   Food insecurity:    Worry: Not on file    Inability: Not on file   Transportation needs:    Medical: Not on file    Non-medical: Not on file  Tobacco Use   Smoking status: Current Every Day Smoker    Packs/day: 0.25   Smokeless tobacco: Never Used  Substance and Sexual Activity   Alcohol use: Yes    Comment: occasionally   Drug use: Not Currently   Sexual activity: Not on file  Lifestyle   Physical activity:    Days per week: Not on file    Minutes per session: Not on file   Stress: Not on file  Relationships   Social connections:    Talks on phone: Not on file    Gets together: Not on file    Attends religious service: Not on file    Active member of club or organization: Not on file    Attends meetings of clubs or organizations: Not on file    Relationship status: Not on file   Intimate partner violence:    Fear of current or ex partner: Not on file    Emotionally abused: Not on file    Physically abused: Not on file    Forced sexual activity: Not on file  Other Topics Concern   Not on file  Social History Narrative   Not on file    FAMILY HISTORY: Family History  Problem Relation Age of Onset   Hypertension Mother    Diabetes Father    Hypertension Father    Heart disease Father     ALLERGIES:  has No Known Allergies.  MEDICATIONS:  Current Outpatient Medications  Medication Sig Dispense Refill   acetaminophen (TYLENOL) 500 MG tablet Take 1,000 mg by mouth every 6 (six) hours as needed for mild pain.     carvedilol (COREG) 25 MG tablet Take 1 tablet (25 mg total) by mouth 2  (two) times daily with a meal. 60 tablet 6   furosemide (LASIX) 40 MG tablet Take 1 tablet (40 mg total) by mouth 2 (two) times daily. 60 tablet 0   hydrALAZINE (APRESOLINE) 25 MG tablet Take 1 tablet (25 mg total) by mouth 3 (three) times daily. 90 tablet 6   imatinib (GLEEVEC) 100 MG tablet Take 2 tablets (200 mg total) by mouth daily. Start with 142m po daily and increase to 2054m(2 tab daily) after 2 weeks if tolerated and  labs stable. Take with meals and large glass of water.Caution:Chemotherapy 60 tablet 1   lisinopril (ZESTRIL) 40 MG tablet Take 1 tablet (40 mg total) by mouth daily. 30 tablet 0   warfarin (COUMADIN) 5 MG tablet Take 5 mg tablets (1 pill) every Monday and Thursday, take 7.5 mg tablets (1.5 pills) every other day. 60 tablet 1   No current facility-administered medications for this visit.     REVIEW OF SYSTEMS:    A 10+ POINT REVIEW OF SYSTEMS WAS OBTAINED including neurology, dermatology, psychiatry, cardiac, respiratory, lymph, extremities, GI, GU, Musculoskeletal, constitutional, breasts, reproductive, HEENT.  All pertinent positives are noted in the HPI.  All others are negative.   PHYSICAL EXAMINATION: ECOG PERFORMANCE STATUS: 2 - Symptomatic, <50% confined to bed  Vitals:   09/07/18 1119  BP: (!) 168/115  Pulse: 72  Resp: 18  Temp: 98.7 F (37.1 C)  SpO2: 97%   Filed Weights   09/07/18 1119  Weight: 176 lb 12.8 oz (80.2 kg)   .Body mass index is 32.34 kg/m.  GENERAL:alert, in no acute distress and comfortable SKIN: no acute rashes, no significant lesions EYES: conjunctiva are pink and non-injected, sclera anicteric OROPHARYNX: MMM, no exudates, no oropharyngeal erythema or ulceration NECK: supple, no JVD LYMPH:  no palpable lymphadenopathy in the cervical, axillary or inguinal regions LUNGS: clear to auscultation b/l with normal respiratory effort HEART: regular rate & rhythm ABDOMEN:  normoactive bowel sounds , non tender, not distended.  Palpable splenomegaly about 2 fingerbreadths beneath left costal margin  Extremity: no pedal edema PSYCH: alert & oriented x 3 with fluent speech NEURO: HOH, some mild cognitive issues, no focal motor/sensory deficits   LABORATORY DATA:  I have reviewed the data as listed  . CBC Latest Ref Rng & Units 09/07/2018 09/04/2018 08/24/2018  WBC 4.0 - 10.5 K/uL 68.9(HH) 62.5(HH) 69.0(HH)  Hemoglobin 12.0 - 15.0 g/dL 13.3 12.9 13.4  Hematocrit 36.0 - 46.0 % 45.4 45.2 46.0  Platelets 150 - 400 K/uL 270 253 452(H)    . CMP Latest Ref Rng & Units 09/07/2018 08/24/2018 08/17/2018  Glucose 70 - 99 mg/dL 96 72 102(H)  BUN 6 - 20 mg/dL 16 22(H) 11  Creatinine 0.44 - 1.00 mg/dL 0.93 0.94 1.10(H)  Sodium 135 - 145 mmol/L 142 140 138  Potassium 3.5 - 5.1 mmol/L 4.3 4.7 4.0  Chloride 98 - 111 mmol/L 101 101 102  CO2 22 - 32 mmol/L 30 28 23   Calcium 8.9 - 10.3 mg/dL 9.8 9.5 8.3(L)  Total Protein 6.5 - 8.1 g/dL 8.2(H) 8.1 -  Total Bilirubin 0.3 - 1.2 mg/dL 0.3 0.4 -  Alkaline Phos 38 - 126 U/L 99 91 -  AST 15 - 41 U/L 42(H) 30 -  ALT 0 - 44 U/L 33 17 -   09/04/18 BM Bx:     07/14/18 BCR-ABL:     RADIOGRAPHIC STUDIES: I have personally reviewed the radiological images as listed and agreed with the findings in the report. Dg Chest 2 View  Result Date: 08/16/2018 CLINICAL DATA:  Shortness of breath EXAM: CHEST - 2 VIEW COMPARISON:  07/14/2018 FINDINGS: Cardiomegaly. Mild vascular congestion. There is no edema, consolidation, effusion, or pneumothorax. No acute osseous finding IMPRESSION: Cardiomegaly and vascular congestion. Electronically Signed   By: Monte Fantasia M.D.   On: 08/16/2018 06:24   Ct Biopsy  Result Date: 09/04/2018 INDICATION: 48 year old female with progressive leukocytosis. Evaluate for chronic myelogenous leukemia. EXAM: CT GUIDED BONE MARROW ASPIRATION AND CORE BIOPSY Interventional Radiologist:  Criselda Peaches, MD MEDICATIONS: None. ANESTHESIA/SEDATION: Moderate (conscious)  sedation was employed during this procedure. A total of 2 milligrams versed and 100 micrograms fentanyl were administered intravenously. The patient's level of consciousness and vital signs were monitored continuously by radiology nursing throughout the procedure under my direct supervision. Total monitored sedation time: 11 minutes FLUOROSCOPY TIME:  None COMPLICATIONS: None immediate. Estimated blood loss: <25 mL PROCEDURE: Informed written consent was obtained from the patient after a thorough discussion of the procedural risks, benefits and alternatives. All questions were addressed. Maximal Sterile Barrier Technique was utilized including caps, mask, sterile gowns, sterile gloves, sterile drape, hand hygiene and skin antiseptic. A timeout was performed prior to the initiation of the procedure. The patient was positioned prone and non-contrast localization CT was performed of the pelvis to demonstrate the iliac marrow spaces. Maximal barrier sterile technique utilized including caps, mask, sterile gowns, sterile gloves, large sterile drape, hand hygiene, and betadine prep. Under sterile conditions and local anesthesia, an 11 gauge coaxial bone biopsy needle was advanced into the right iliac marrow space. Needle position was confirmed with CT imaging. Initially, bone marrow aspiration was performed. Next, the 11 gauge outer cannula was utilized to obtain a right iliac bone marrow core biopsy. Needle was removed. Hemostasis was obtained with compression. The patient tolerated the procedure well. Samples were prepared with the cytotechnologist. IMPRESSION: Technically successful right-sided iliac bone marrow aspiration and biopsy. Electronically Signed   By: Jacqulynn Cadet M.D.   On: 09/04/2018 12:53   Ct Bone Marrow Biopsy & Aspiration  Result Date: 09/04/2018 INDICATION: 48 year old female with progressive leukocytosis. Evaluate for chronic myelogenous leukemia. EXAM: CT GUIDED BONE MARROW ASPIRATION AND  CORE BIOPSY Interventional Radiologist:  Criselda Peaches, MD MEDICATIONS: None. ANESTHESIA/SEDATION: Moderate (conscious) sedation was employed during this procedure. A total of 2 milligrams versed and 100 micrograms fentanyl were administered intravenously. The patient's level of consciousness and vital signs were monitored continuously by radiology nursing throughout the procedure under my direct supervision. Total monitored sedation time: 11 minutes FLUOROSCOPY TIME:  None COMPLICATIONS: None immediate. Estimated blood loss: <25 mL PROCEDURE: Informed written consent was obtained from the patient after a thorough discussion of the procedural risks, benefits and alternatives. All questions were addressed. Maximal Sterile Barrier Technique was utilized including caps, mask, sterile gowns, sterile gloves, sterile drape, hand hygiene and skin antiseptic. A timeout was performed prior to the initiation of the procedure. The patient was positioned prone and non-contrast localization CT was performed of the pelvis to demonstrate the iliac marrow spaces. Maximal barrier sterile technique utilized including caps, mask, sterile gowns, sterile gloves, large sterile drape, hand hygiene, and betadine prep. Under sterile conditions and local anesthesia, an 11 gauge coaxial bone biopsy needle was advanced into the right iliac marrow space. Needle position was confirmed with CT imaging. Initially, bone marrow aspiration was performed. Next, the 11 gauge outer cannula was utilized to obtain a right iliac bone marrow core biopsy. Needle was removed. Hemostasis was obtained with compression. The patient tolerated the procedure well. Samples were prepared with the cytotechnologist. IMPRESSION: Technically successful right-sided iliac bone marrow aspiration and biopsy. Electronically Signed   By: Jacqulynn Cadet M.D.   On: 09/04/2018 12:53    ASSESSMENT & PLAN:   48 y.o. female with  1. 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 -Discussed that the patient's CHF is limiting from a dose standpoint  -Will start pt gradually on medication thought to be safest for her- Imatinib with gradual dose escalation. -Discussed that the pt will need active follow up with her coumadin managing clinic, and cardiology as well -Provided supplemental information -Recommend pt begins Imatinib in the next 1-2 weeks, which she and her daughter agree with -Placed a referral to Education officer, museum for assistance in coordinating cares given multiple barriers to care, help with setting up daughter as Animator which patient prefers. -Will see the pt back in 3 weeks  2.  Patient Active Problem List   Diagnosis Date Noted   CML (chronic myelocytic leukemia) ([place]) 08/24/2018   Left ventricular thrombus without MI 08/16/2018   Leukocytosis 08/16/2018   CHF (congestive heart failure) ([place]) 07/14/2018   Coronary artery disease 07/14/2018   Odontogenic infection 07/14/2018   Essential (primary) hypertension 09/20/2017   History of stroke 09/20/2017  - setup local PCP and cardiologist.   RTC with Dr Irene Limbo 2 weeks  after starting Imatinib (3 weeks from now) with labs for toxicity check   Orders Placed This Encounter  Procedures   CBC with Differential/Platelet    Standing Status:   Future    Standing Expiration Date:   10/12/2019   CMP ([place] only)    Standing Status:   Future    Standing Expiration Date:   09/07/2019   Protime-INR    Standing Status:   Future    Standing Expiration Date:   09/07/2019   Lactate dehydrogenase    Standing Status:   Future    Standing Expiration Date:   09/07/2019     All of the patients questions were answered with apparent satisfaction. The patient knows to call the clinic with any problems, questions or concerns.  The total time spent in the appt was 30 minutes and more than 50% was on counseling and direct patient cares.    Sullivan Lone MD MS AAHIVMS [facility] Hematology/Oncology Physician [facility]  (Office):       [phone] (Work cell):  [phone] (Fax):           [phone]  09/07/2018 12:23 PM  I, Baldwin [place], am acting as a scribe for Dr. Sullivan Lone.  Brunetta Genera MD

## 2018-09-07 ENCOUNTER — Inpatient Hospital Stay (HOSPITAL_BASED_OUTPATIENT_CLINIC_OR_DEPARTMENT_OTHER): Payer: Medicare Other | Admitting: Hematology

## 2018-09-07 ENCOUNTER — Telehealth: Payer: Self-pay | Admitting: Pharmacist

## 2018-09-07 ENCOUNTER — Telehealth: Payer: Self-pay | Admitting: Hematology

## 2018-09-07 ENCOUNTER — Inpatient Hospital Stay: Payer: Medicare Other

## 2018-09-07 ENCOUNTER — Other Ambulatory Visit: Payer: Self-pay

## 2018-09-07 VITALS — BP 168/115 | HR 72 | Temp 98.7°F | Resp 18 | Ht 62.0 in | Wt 176.8 lb

## 2018-09-07 DIAGNOSIS — Z7901 Long term (current) use of anticoagulants: Secondary | ICD-10-CM | POA: Diagnosis not present

## 2018-09-07 DIAGNOSIS — I2510 Atherosclerotic heart disease of native coronary artery without angina pectoris: Secondary | ICD-10-CM | POA: Diagnosis not present

## 2018-09-07 DIAGNOSIS — C9210 Chronic myeloid leukemia, BCR/ABL-positive, not having achieved remission: Secondary | ICD-10-CM

## 2018-09-07 DIAGNOSIS — Z8673 Personal history of transient ischemic attack (TIA), and cerebral infarction without residual deficits: Secondary | ICD-10-CM

## 2018-09-07 DIAGNOSIS — F17210 Nicotine dependence, cigarettes, uncomplicated: Secondary | ICD-10-CM

## 2018-09-07 DIAGNOSIS — I110 Hypertensive heart disease with heart failure: Secondary | ICD-10-CM

## 2018-09-07 LAB — CBC WITH DIFFERENTIAL (CANCER CENTER ONLY)
Abs Immature Granulocytes: 17.93 10*3/uL — ABNORMAL HIGH (ref 0.00–0.07)
Basophils Absolute: 1.1 10*3/uL — ABNORMAL HIGH (ref 0.0–0.1)
Basophils Relative: 2 %
Eosinophils Absolute: 0.9 10*3/uL — ABNORMAL HIGH (ref 0.0–0.5)
Eosinophils Relative: 1 %
HCT: 45.4 % (ref 36.0–46.0)
Hemoglobin: 13.3 g/dL (ref 12.0–15.0)
Immature Granulocytes: 26 %
Lymphocytes Relative: 9 %
Lymphs Abs: 5.8 10*3/uL — ABNORMAL HIGH (ref 0.7–4.0)
MCH: 22.1 pg — ABNORMAL LOW (ref 26.0–34.0)
MCHC: 29.3 g/dL — ABNORMAL LOW (ref 30.0–36.0)
MCV: 75.5 fL — ABNORMAL LOW (ref 80.0–100.0)
Monocytes Absolute: 2 10*3/uL — ABNORMAL HIGH (ref 0.1–1.0)
Monocytes Relative: 3 %
Neutro Abs: 41.2 10*3/uL — ABNORMAL HIGH (ref 1.7–7.7)
Neutrophils Relative %: 59 %
Platelet Count: 270 10*3/uL (ref 150–400)
RBC: 6.01 MIL/uL — ABNORMAL HIGH (ref 3.87–5.11)
RDW: 24.7 % — ABNORMAL HIGH (ref 11.5–15.5)
WBC Count: 68.9 10*3/uL (ref 4.0–10.5)
nRBC: 0.1 % (ref 0.0–0.2)

## 2018-09-07 LAB — SAMPLE TO BLOOD BANK

## 2018-09-07 LAB — CMP (CANCER CENTER ONLY)
ALT: 33 U/L (ref 0–44)
AST: 42 U/L — ABNORMAL HIGH (ref 15–41)
Albumin: 3.5 g/dL (ref 3.5–5.0)
Alkaline Phosphatase: 99 U/L (ref 38–126)
Anion gap: 11 (ref 5–15)
BUN: 16 mg/dL (ref 6–20)
CO2: 30 mmol/L (ref 22–32)
Calcium: 9.8 mg/dL (ref 8.9–10.3)
Chloride: 101 mmol/L (ref 98–111)
Creatinine: 0.93 mg/dL (ref 0.44–1.00)
GFR, Est AFR Am: >60 mL/min (ref >60)
GFR, Estimated: >60 mL/min (ref >60)
Glucose, Bld: 96 mg/dL (ref 70–99)
Potassium: 4.3 mmol/L (ref 3.5–5.1)
Sodium: 142 mmol/L (ref 135–145)
Total Bilirubin: 0.3 mg/dL (ref 0.3–1.2)
Total Protein: 8.2 g/dL — ABNORMAL HIGH (ref 6.5–8.1)

## 2018-09-07 LAB — LACTATE DEHYDROGENASE: LDH: 732 U/L — ABNORMAL HIGH (ref 98–192)

## 2018-09-07 MED ORDER — IMATINIB MESYLATE 100 MG PO TABS: ORAL_TABLET | ORAL | 0 refills | Status: DC

## 2018-09-07 MED ORDER — IMATINIB MESYLATE 100 MG PO TABS: 200.0000 mg | ORAL_TABLET | Freq: Every day | ORAL | 1 refills | Status: DC

## 2018-09-07 MED FILL — hydrALAZINE HCL 25 MG TABS: 25 | 30 days supply | Qty: 90 | Fill #0

## 2018-09-07 MED FILL — CARVEDILOL 25 MG TABLET: 25 | 30 days supply | Qty: 60 | Fill #0

## 2018-09-07 MED FILL — WARFARIN SODIUM 5 MG TABLET: 5 | 30 days supply | Qty: 60 | Fill #0

## 2018-09-07 NOTE — Telephone Encounter (Signed)
Oral Oncology Pharmacist Encounter  Received new prescription for Gleevec (imatinib) for the treatment of newly diagnosed chronic myeloid leukemia, planned duration until disease progression or unacceptable toxicity.  Labs from 09/07/2018 assessed, OK for treatment initiation.  Noted patient with baseline congestive heart failure ECHO performed 07/14/18 shows very low LVEF at 20-25%, MD is aware and plans initiation of imatinib at low dose and slow up-titration based on toleration and response ECHO will be repeated if clinically indicated Patient and daughter will be counseled to monitor for changes in LE edema, other fluid retention, or dyspnea  Current medication list in Epic reviewed, DDIs with imatinib identified:  Imatinib and warfarin: category D interaction: Imatinib may enhance the anticoagulant effect of warfarin by decreasing the metabolism of warfarin through inhibition of CYP3A4 and possibly CYP2C9. INR should be closely monitored at imatinib initiation, discontinuation, and at dose changes. MD has stressed this with patient. I will ensure additional counseling around this topic, and to monitor closely for signs/symptoms of bleeding, during initiial counseling session, as well. Note will also be routed to patient's warfarin clinic.  Imatinib and carvedilol: category C interaction: imatinib is an inhibitor of CYP2D6 thereby leading to possible decreased metabolism and increased systemic exposure to patient's carvedilol. BPs and HRs in Epic reviewed, some small increase in exposure to carvedilol may be tolerated without issue. Note will also be routed to patient's PCP.  Prescription has been e-scribed to the [facility] for benefits analysis and approval.  We have been informed that best contact for patient is daughter, Valbona Slabach, at [phone], who is HCPOA.  Oral Oncology Clinic will continue to follow for insurance authorization, copayment issues, initial  counseling and start date.  Johny Drilling, PharmD, BCPS, BCOP  09/07/2018 12:25 PM Oral Oncology Clinic [phone]

## 2018-09-07 NOTE — Telephone Encounter (Signed)
Noted, thank you. Patient has INR check planned for 09/14/18.

## 2018-09-07 NOTE — Telephone Encounter (Signed)
Called and scheduled appt per 4/30 los.  Patient voice mail box not set up, was not able to leave a voice message.  A calendar will be mailed out.

## 2018-09-08 ENCOUNTER — Telehealth: Payer: Self-pay

## 2018-09-08 MED FILL — IMATINIB MESYLATE 100 MG TA: 100 | 28 days supply | Qty: 46 | Fill #0

## 2018-09-08 NOTE — Telephone Encounter (Signed)
Oral Chemotherapy Pharmacist Encounter   I spoke with patient's daughter, Charlotte Wyatt, for overview of [name] (imatinib) for the treatment of newly diagnosed chronic myeloid leukemia, planned duration until disease progression or unacceptable toxicity.  Counseled on administration, dosing, side effects, monitoring, drug-food interactions, safe handling, storage, and disposal.  Gleevec will be initiated on a slow up-titration schedule based on tolerance and response.  Weeks 1 and 2: Patient will take Gleevec 100mg  tablets, 1 tablet (100mg ) by mouth once daily with a meal and a large glass of water.  If tolerated, starting with week 3: Patient will take Gleevec 100mg  tablets, 2 tablets (200mg ) by mouth once daily with a meal and a large glass of water.  Continued up titration of Gleevec will be decided in the future based on experience with the 1st month.  Charlotte Wyatt knows food may decrease stomach irritation and that her mom should maintain hydration while on treatment with Gleevec, as much as possible, to avoid renal toxicity and fluid overlaod.  Charlotte Wyatt states lunch is a stable meal for her mom so she will have her take her [name] immediately after lunch each day.  Patient will avoid grapefruit and grapefruit juice.  Gleevec start date: 09/10/2018 or 09/12/2018  Adverse effects include but are not limited to: nausea, vomiting, diarrhea, fatigue, muscle cramps, lower extremity edema, rash, decreased blood counts, GI bleeding, and cardiac dysfunction.  We discussed baseline heart failure and low LVEF. They will be monitoring for changes in weight, edema, and dyspnea.  They will obtain anti diarrheal and alert the office of 4 or more loose stools above baseline.  Reviewed importance of keeping a medication schedule and plan for any missed doses.  Medication reconciliation performed and medication/allergy list updated.  We discussed interaction with warfarin and that clinic that manages her  mom's INR has been informed about new medication.  Insurance authorization for [name] has been obtained. Test claim at the pharmacy revealed copayment $1.21 for 1st fill of imatinib 100mg  tablets.  This will ship from the [name] on 09/08/2018 to deliver to patient's home on 5/2 or 5/4 depending on UPS routes.  Charlotte Wyatt informed the pharmacy will reach out 5-7 days prior to needing next fill of Gleevec to coordinate continued medication acquisition to prevent break in therapy.  All questions answered.  Charlotte Wyatt voiced understanding and appreciation.   Patient knows to call the office with questions or concerns.  Johny Drilling, PharmD, BCPS, BCOP  09/08/2018   1:26 PM Oral Oncology Clinic [phone]

## 2018-09-08 NOTE — Telephone Encounter (Signed)
Oral Oncology Patient Advocate Encounter  Received notification from [employer] that prior authorization for Imatinib is required.  PA submitted on CoverMyMeds Key [id number] Status is pending  Oral Oncology Clinic will continue to follow.  [place] Patient [place] Phone [phone] Fax [phone] 09/08/2018    10:34 AM

## 2018-09-08 NOTE — Telephone Encounter (Signed)
Oral Oncology Patient Advocate Encounter  Prior Authorization for Imatinib has been approved.    PA# [id number] Effective dates: 06/10/18 through 09/07/21  Oral Oncology Clinic will continue to follow.   [place] Patient [place] Phone [phone] Fax [phone] 09/08/2018    10:44 AM

## 2018-09-11 ENCOUNTER — Encounter (HOSPITAL_COMMUNITY): Payer: Self-pay | Admitting: Hematology

## 2018-09-11 MED FILL — FUROSEMIDE 40 MG TAB: 40 | 30 days supply | Qty: 60 | Fill #0

## 2018-09-11 MED FILL — LISINOPRIL 40 MG TABLET: 40 | 30 days supply | Qty: 30 | Fill #0

## 2018-09-11 NOTE — Telephone Encounter (Signed)
Noted  

## 2018-09-14 ENCOUNTER — Ambulatory Visit: Payer: Medicare Other | Attending: Family Medicine | Admitting: Pharmacist

## 2018-09-14 ENCOUNTER — Other Ambulatory Visit: Payer: Self-pay

## 2018-09-14 ENCOUNTER — Encounter: Payer: Self-pay | Admitting: Pharmacist

## 2018-09-14 DIAGNOSIS — I513 Intracardiac thrombosis, not elsewhere classified: Secondary | ICD-10-CM | POA: Diagnosis present

## 2018-09-14 DIAGNOSIS — I240 Acute coronary thrombosis not resulting in myocardial infarction: Secondary | ICD-10-CM

## 2018-09-14 LAB — POCT INR: INR: 1.7 — AB (ref 2.0–3.0)

## 2018-09-14 MED FILL — LISINOPRIL 40 MG TABLET: 40 | 30 days supply | Qty: 30 | Fill #0

## 2018-09-14 MED FILL — FUROSEMIDE 40 MG TAB: 40 | 30 days supply | Qty: 60 | Fill #0

## 2018-09-25 ENCOUNTER — Ambulatory Visit: Payer: Medicare Other | Attending: Family Medicine | Admitting: Pharmacist

## 2018-09-25 ENCOUNTER — Other Ambulatory Visit: Payer: Self-pay

## 2018-09-25 ENCOUNTER — Ambulatory Visit: Payer: Medicare Other | Admitting: Pharmacist

## 2018-09-25 DIAGNOSIS — I513 Intracardiac thrombosis, not elsewhere classified: Secondary | ICD-10-CM | POA: Insufficient documentation

## 2018-09-25 DIAGNOSIS — I240 Acute coronary thrombosis not resulting in myocardial infarction: Secondary | ICD-10-CM

## 2018-09-25 LAB — POCT INR: INR: 1.7 — AB (ref 2.0–3.0)

## 2018-09-27 NOTE — Progress Notes (Signed)
HEMATOLOGY/ONCOLOGY CONSULTATION NOTE  Date of Service: 09/28/2018  Patient Care Team: System, Pcp Not In as PCP - General   Dr. 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 female who has been referred to [place] by Dr. Lonia Skinner for evaluation and management of her Leukocytosis. She is accompanied today by her daughter via cell phone. The pt reports that she is doing well overall.  Prior to today's visit, the pt was admitted in both early March and early April for her CHF. At both times, she was seen to have leukocytosis and a BCR-ABL was collected which was positive. She was then referred to see me.  The pt reports that she has "been good," in the last 6 months except when she has had fluid overload issues. The pt notes that her cardiac concerns are thought to be from "smoking and drinking." The pt's daughter notes that the pt previously had a concern for excessive alcohol use. The pt had a stroke in the past which her daughter notes affected the patient's memory initially. She has thus far been making medical decisions for herself.  The pt is currently living in [place], [place] but is trying to move to [place] where her daughter lives. She has been seeing a PCP, Neurology, and Cardiology in [place], and is not sure which clinic or doctors she has seen specifically. She has not established care with these specialties in [place] yet.  The pt notes that she has had weight loss recently, but is unsure how much. The pt denies abdominal pains.   Most recent lab results (08/17/18) of CBC w/diff and BMP is as follows: all values are WNL except for WBC at 43.2k, HGB at 10.5, HCT at 35.8, MCV at 74.9, MCH at 22.0, MCHC at 29.3, RDW at 23.2, nRBC at 0.8%, ANC at 29.8k, Lymphs abs at 4.3k, Monocytes abs at 1.7k, Eosinophils abs at 900, Basophils abs at 2.6k, Abs immature  granulocytes at 3.9k, Glucose at 102, Creatinine at 1.10, Calcium at 8.3, GFR at 59.  On review of systems, pt reports good energy levels, and denies abdominal pains, and any other symptoms.   On PMHx the pt reports stroke, CHF. 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 The patient's last visit with [place] was on 09/07/18. The pt reports that she is doing well overall. The pt is accompanied today by her daughter Jamal Maes.  The pt reports that she was able to begin Imatinib and that she is now in her third week, and has been on 263m Imatinib for the past 5 days. The pt denies any problems tolerating the medication thus far and denies nausea, vomiting, or diarrhea. She denies leg swelling at this time. She and her daughter are unsure if she has seen her PCP in the interim, but believe that the patient's PCP is Dr. PAsencion Noble   Lab results today (09/28/18) of CBC w/diff and CMP is as follows: all values are WNL except for RBC at 5.63, MCV at 76.0, MCH at 22.0, MCHC at 29.0, RDW at 25.3, Glucose at 139, Calcium at 8.6, Albumin at 3.1, Total Bilirubin at 0.2.  On review of systems, pt reports stable energy levels, stable weight, and denies nausea, diarrhea, vomiting, abdominal pains,  leg swelling, and any other symptoms.   MEDICAL HISTORY:  Past Medical History:  Diagnosis Date   CAD (coronary artery disease)    Cardiomyopathy, secondary ([place])    Central hearing loss    CHF (congestive heart failure) (HCC)    Essential hypertension    History of stroke    Menopausal symptoms    Mixed hyperlipidemia    Stroke ([place])     SURGICAL HISTORY: Past Surgical History:  Procedure Laterality Date   ATRIAL CARDIAC PACEMAKER INSERTION     TUBAL LIGATION      SOCIAL HISTORY: Social History   Socioeconomic History   Marital  status: Single    Spouse name: Not on file   Number of children: 2   Years of education: Not on file   Highest education level: Not on file  Occupational History   Not on file  Social Needs   Financial resource strain: Not on file   Food insecurity:    Worry: Not on file    Inability: Not on file   Transportation needs:    Medical: Not on file    Non-medical: Not on file  Tobacco Use   Smoking status: Current Every Day Smoker    Packs/day: 0.25   Smokeless tobacco: Never Used  Substance and Sexual Activity   Alcohol use: Yes    Comment: occasionally   Drug use: Not Currently   Sexual activity: Not on file  Lifestyle   Physical activity:    Days per week: Not on file    Minutes per session: Not on file   Stress: Not on file  Relationships   Social connections:    Talks on phone: Not on file    Gets together: Not on file    Attends religious service: Not on file    Active member of club or organization: Not on file    Attends meetings of clubs or organizations: Not on file    Relationship status: Not on file   Intimate partner violence:    Fear of current or ex partner: Not on file    Emotionally abused: Not on file    Physically abused: Not on file    Forced sexual activity: Not on file  Other Topics Concern   Not on file  Social History Narrative   Not on file    FAMILY HISTORY: Family History  Problem Relation Age of Onset   Hypertension Mother    Diabetes Father    Hypertension Father    Heart disease Father     ALLERGIES:  has No Known Allergies.  MEDICATIONS:  Current Outpatient Medications  Medication Sig Dispense Refill   acetaminophen (TYLENOL) 500 MG tablet Take 1,000 mg by mouth every 6 (six) hours as needed for mild pain.     carvedilol (COREG) 25 MG tablet Take 1 tablet (25 mg total) by mouth 2 (two) times daily with a meal. 60 tablet 6   furosemide (LASIX) 40 MG tablet Take 1 tablet (40 mg total) by mouth 2 (two)  times daily. 60 tablet 0   hydrALAZINE (APRESOLINE) 25 MG tablet Take 1 tablet (25 mg total) by mouth 3 (three) times daily. 90 tablet 6   imatinib (GLEEVEC) 100 MG tablet Take 130m by mouth daily for 2 weeks. If tolerated, increase to 2073monce daily. Take with meals and large glass of water. 46 tablet 0   lisinopril (ZESTRIL) 40 MG tablet Take 1 tablet (40 mg total) by mouth daily. 30 tablet  0   warfarin (COUMADIN) 5 MG tablet Take 5 mg tablets (1 pill) every Monday and Thursday, take 7.5 mg tablets (1.5 pills) every other day. 60 tablet 1   No current facility-administered medications for this visit.     REVIEW OF SYSTEMS:    A 10+ POINT REVIEW OF SYSTEMS WAS OBTAINED including neurology, dermatology, psychiatry, cardiac, respiratory, lymph, extremities, GI, GU, Musculoskeletal, constitutional, breasts, reproductive, HEENT.  All pertinent positives are noted in the HPI.  All others are negative.  PHYSICAL EXAMINATION: ECOG PERFORMANCE STATUS: 2 - Symptomatic, <50% confined to bed  Vitals:   09/28/18 0904  BP: 129/60  Pulse: 77  Resp: 17  Temp: 98.2 F (36.8 C)  SpO2: 93%   Filed Weights   09/28/18 0904  Weight: 187 lb 11.2 oz (85.1 kg)   .Body mass index is 34.33 kg/m.  GENERAL:alert, in no acute distress and comfortable SKIN: no acute rashes, no significant lesions EYES: conjunctiva are pink and non-injected, sclera anicteric OROPHARYNX: MMM, no exudates, no oropharyngeal erythema or ulceration NECK: supple, no JVD LYMPH:  no palpable lymphadenopathy in the cervical, axillary or inguinal regions LUNGS: clear to auscultation b/l with normal respiratory effort HEART: regular rate & rhythm ABDOMEN:  normoactive bowel sounds , non tender, not distended. Palpable splenomegaly about 2 fingerbreadths beneath left costal margin Extremity: no pedal edema PSYCH: alert & oriented x 3 with fluent speech NEURO: HOH, some mild cognitive issues, no focal motor/sensory  deficits  LABORATORY DATA:  I have reviewed the data as listed  . CBC Latest Ref Rng & Units 09/28/2018 09/07/2018 09/04/2018  WBC 4.0 - 10.5 K/uL 8.0 68.9(HH) 62.5(HH)  Hemoglobin 12.0 - 15.0 g/dL 12.4 13.3 12.9  Hematocrit 36.0 - 46.0 % 42.8 45.4 45.2  Platelets 150 - 400 K/uL 268 270 253    . CMP Latest Ref Rng & Units 09/28/2018 09/07/2018 08/24/2018  Glucose 70 - 99 mg/dL 139(H) 96 72  BUN 6 - 20 mg/dL 15 16 22(H)  Creatinine 0.44 - 1.00 mg/dL 0.96 0.93 0.94  Sodium 135 - 145 mmol/L 142 142 140  Potassium 3.5 - 5.1 mmol/L 3.7 4.3 4.7  Chloride 98 - 111 mmol/L 102 101 101  CO2 22 - 32 mmol/L _0 Calcium 8.9 - 10.3 mg/dL 8.6(L) 9.8 9.5  Total Protein 6.5 - 8.1 g/dL 7.3 8.2(H) 8.1  Total Bilirubin 0.3 - 1.2 mg/dL 0.2(L) 0.3 0.4  Alkaline Phos 38 - 126 U/L 85 99 91  AST 15 - 41 U/L 26 42(H) 30  ALT 0 - 44 U/L 25 33 17   08/30/18 Initial BCR-ABL:    09/04/18 BM Bx:     07/14/18 BCR-ABL:     RADIOGRAPHIC STUDIES: I have personally reviewed the radiological images as listed and agreed with the findings in the report. Ct Biopsy  Result Date: 09/04/2018 INDICATION: 48 year old female with progressive leukocytosis. Evaluate for chronic myelogenous leukemia. EXAM: CT GUIDED BONE MARROW ASPIRATION AND CORE BIOPSY Interventional Radiologist:  Criselda Peaches, MD MEDICATIONS: None. ANESTHESIA/SEDATION: Moderate (conscious) sedation was employed during this procedure. A total of 2 milligrams versed and 100 micrograms fentanyl were administered intravenously. The patient's level of consciousness and vital signs were monitored continuously by radiology nursing throughout the procedure under my direct supervision. Total monitored sedation time: 11 minutes FLUOROSCOPY TIME:  None COMPLICATIONS: None immediate. Estimated blood loss: <25 mL PROCEDURE: Informed written consent was obtained from the patient after a thorough discussion of the procedural risks, benefits and alternatives.  All questions were addressed. Maximal Sterile Barrier Technique was utilized including caps, mask, sterile gowns, sterile gloves, sterile drape, hand hygiene and skin antiseptic. A timeout was performed prior to the initiation of the procedure. The patient was positioned prone and non-contrast localization CT was performed of the pelvis to demonstrate the iliac marrow spaces. Maximal barrier sterile technique utilized including caps, mask, sterile gowns, sterile gloves, large sterile drape, hand hygiene, and betadine prep. Under sterile conditions and local anesthesia, an 11 gauge coaxial bone biopsy needle was advanced into the right iliac marrow space. Needle position was confirmed with CT imaging. Initially, bone marrow aspiration was performed. Next, the 11 gauge outer cannula was utilized to obtain a right iliac bone marrow core biopsy. Needle was removed. Hemostasis was obtained with compression. The patient tolerated the procedure well. Samples were prepared with the cytotechnologist. IMPRESSION: Technically successful right-sided iliac bone marrow aspiration and biopsy. Electronically Signed   By: Jacqulynn Cadet M.D.   On: 09/04/2018 12:53   Ct Bone Marrow Biopsy & Aspiration  Result Date: 09/04/2018 INDICATION: 48 year old female with progressive leukocytosis. Evaluate for chronic myelogenous leukemia. EXAM: CT GUIDED BONE MARROW ASPIRATION AND CORE BIOPSY Interventional Radiologist:  Criselda Peaches, MD MEDICATIONS: None. ANESTHESIA/SEDATION: Moderate (conscious) sedation was employed during this procedure. A total of 2 milligrams versed and 100 micrograms fentanyl were administered intravenously. The patient's level of consciousness and vital signs were monitored continuously by radiology nursing throughout the procedure under my direct supervision. Total monitored sedation time: 11 minutes FLUOROSCOPY TIME:  None COMPLICATIONS: None immediate. Estimated blood loss: <25 mL PROCEDURE: Informed  written consent was obtained from the patient after a thorough discussion of the procedural risks, benefits and alternatives. All questions were addressed. Maximal Sterile Barrier Technique was utilized including caps, mask, sterile gowns, sterile gloves, sterile drape, hand hygiene and skin antiseptic. A timeout was performed prior to the initiation of the procedure. The patient was positioned prone and non-contrast localization CT was performed of the pelvis to demonstrate the iliac marrow spaces. Maximal barrier sterile technique utilized including caps, mask, sterile gowns, sterile gloves, large sterile drape, hand hygiene, and betadine prep. Under sterile conditions and local anesthesia, an 11 gauge coaxial bone biopsy needle was advanced into the right iliac marrow space. Needle position was confirmed with CT imaging. Initially, bone marrow aspiration was performed. Next, the 11 gauge outer cannula was utilized to obtain a right iliac bone marrow core biopsy. Needle was removed. Hemostasis was obtained with compression. The patient tolerated the procedure well. Samples were prepared with the cytotechnologist. IMPRESSION: Technically successful right-sided iliac bone marrow aspiration and biopsy. Electronically Signed   By: Jacqulynn Cadet M.D.   On: 09/04/2018 12:53    ASSESSMENT & PLAN:   48 y.o. female with  1. 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 ANC at 29.8k, Lymphs at 4.3k, Monocytes at 1.7k, Eosinophils at 900, Basophils ab 2.6k and Immature grans at 3.9k  07/14/18 BCR-ABL test revealed b2a2 at 16.7455%, b3a2 at 13.1258% and E1A2 at 0.0157% Reviewed the pt's previous labs: WBC have been increasing since earliest available labs in August 2019, with increasing basophilia and anemia 07/14/18 ECHO revealed LV EF of 20-25% 09/04/18 BM Bx revealed hypercellular bone  marrow with myeloproliferative neoplasm consistent with chronic phase CML  PLAN -Discussed pt labwork today, 09/28/18; WBC have normalized from 68.9k to 8.0k. HGB and  PLT normal. Chemistries stable. -Initial pre-treatment 08/30/18 BCR-ABL revealed IS at 134.997% -The pt has no prohibitive toxicities from continuing 278m Imatinib for the next month, at this time. If pt continues tolerating this dose over the next month, will then consider increasing to 3065mImatinib. -Pt does not have ability to comprehend all parts of discussion, and her daughter LaAnnie Sableas present over the phone and is aware of the plan to continue with 200107mmatinib for the next month, and to call us [place] any concerns -Given her inability to report side effects, overall goals of care, and overall burden of medical comorbidities, will proceed with slow dose increase  -Goal to be less than 10% by 3 months -Continue follow up with PCP Dr. PatAsencion Nobled establish care with Cardiology locally. -Continue follow up with PCP for Coumadin management -Placed a referral to Social Worker for assistance in coordinating cares given multiple barriers to care, help with setting up daughter as legal HCPOA which patient prefers. -Will see the pt back in 1 month  2.  Patient Active Problem List   Diagnosis Date Noted   CML (chronic myelocytic leukemia) (HCCMadill4/16/2020   Left ventricular thrombus without MI 08/16/2018   Leukocytosis 08/16/2018   CHF (congestive heart failure) ([facility] [place]3/10/2018   Coronary artery disease 07/14/2018   Odontogenic infection 07/14/2018   Essential (primary) hypertension 09/20/2017   History of stroke 09/20/2017  - setup local PCP and cardiologist.   RTC with Dr KalIrene Limboth labs in 1 month   Orders Placed This Encounter  Procedures   CBC with Differential/Platelet    Standing Status:   Future    Standing Expiration Date:   11/02/2019   CMP ([place])    Standing Status:   Future     Standing Expiration Date:   09/28/2019   Lactate dehydrogenase    Standing Status:   Future    Standing Expiration Date:   09/28/2019     All of the patients questions were answered with apparent satisfaction. The patient knows to call the clinic with any problems, questions or concerns.  The total time spent in the appt was 25 minutes and more than 50% was on counseling and direct patient cares.    GauSullivan Lone MS AAHIVMS SCHCapital Medical CenterHOkeene Municipal Hospitalmatology/Oncology Physician ConSaratoga Schenectady Endoscopy Center LLCOffice):       [phone] cell):  [phone]):           [phone]/21/2020 10:00 AM  I, SchBaldwin Jamaicam acting as a scribe for Dr. GauSullivan Lone .I have reviewed the above documentation for accuracy and completeness, and I agree with the above. .GaBrunetta Genera

## 2018-09-28 ENCOUNTER — Inpatient Hospital Stay: Payer: Medicare Other | Attending: Hematology

## 2018-09-28 ENCOUNTER — Telehealth: Payer: Self-pay | Admitting: Hematology

## 2018-09-28 ENCOUNTER — Other Ambulatory Visit: Payer: Self-pay

## 2018-09-28 ENCOUNTER — Inpatient Hospital Stay (HOSPITAL_BASED_OUTPATIENT_CLINIC_OR_DEPARTMENT_OTHER): Payer: Medicare Other | Admitting: Hematology

## 2018-09-28 VITALS — BP 129/60 | HR 77 | Temp 98.2°F | Resp 17 | Ht 62.0 in | Wt 187.7 lb

## 2018-09-28 DIAGNOSIS — Z8673 Personal history of transient ischemic attack (TIA), and cerebral infarction without residual deficits: Secondary | ICD-10-CM | POA: Insufficient documentation

## 2018-09-28 DIAGNOSIS — C9210 Chronic myeloid leukemia, BCR/ABL-positive, not having achieved remission: Secondary | ICD-10-CM

## 2018-09-28 DIAGNOSIS — Z8249 Family history of ischemic heart disease and other diseases of the circulatory system: Secondary | ICD-10-CM

## 2018-09-28 DIAGNOSIS — I509 Heart failure, unspecified: Secondary | ICD-10-CM | POA: Insufficient documentation

## 2018-09-28 DIAGNOSIS — Z833 Family history of diabetes mellitus: Secondary | ICD-10-CM | POA: Diagnosis not present

## 2018-09-28 DIAGNOSIS — F17210 Nicotine dependence, cigarettes, uncomplicated: Secondary | ICD-10-CM

## 2018-09-28 DIAGNOSIS — I10 Essential (primary) hypertension: Secondary | ICD-10-CM

## 2018-09-28 DIAGNOSIS — D649 Anemia, unspecified: Secondary | ICD-10-CM

## 2018-09-28 DIAGNOSIS — Z7289 Other problems related to lifestyle: Secondary | ICD-10-CM | POA: Diagnosis not present

## 2018-09-28 DIAGNOSIS — Z7901 Long term (current) use of anticoagulants: Secondary | ICD-10-CM

## 2018-09-28 LAB — CBC WITH DIFFERENTIAL/PLATELET
Abs Immature Granulocytes: 0.39 10*3/uL — ABNORMAL HIGH (ref 0.00–0.07)
Basophils Absolute: 0.4 10*3/uL — ABNORMAL HIGH (ref 0.0–0.1)
Basophils Relative: 4 %
Eosinophils Absolute: 0.5 10*3/uL (ref 0.0–0.5)
Eosinophils Relative: 6 %
HCT: 42.8 % (ref 36.0–46.0)
Hemoglobin: 12.4 g/dL (ref 12.0–15.0)
Immature Granulocytes: 5 %
Lymphocytes Relative: 29 %
Lymphs Abs: 2.3 10*3/uL (ref 0.7–4.0)
MCH: 22 pg — ABNORMAL LOW (ref 26.0–34.0)
MCHC: 29 g/dL — ABNORMAL LOW (ref 30.0–36.0)
MCV: 76 fL — ABNORMAL LOW (ref 80.0–100.0)
Monocytes Absolute: 0.7 10*3/uL (ref 0.1–1.0)
Monocytes Relative: 9 %
Neutro Abs: 3.8 10*3/uL (ref 1.7–7.7)
Neutrophils Relative %: 47 %
Platelets: 268 10*3/uL (ref 150–400)
RBC: 5.63 MIL/uL — ABNORMAL HIGH (ref 3.87–5.11)
RDW: 25.3 % — ABNORMAL HIGH (ref 11.5–15.5)
WBC: 8 10*3/uL (ref 4.0–10.5)
nRBC: 0.2 % (ref 0.0–0.2)

## 2018-09-28 LAB — CMP (CANCER CENTER ONLY)
ALT: 25 U/L (ref 0–44)
AST: 26 U/L (ref 15–41)
Albumin: 3.1 g/dL — ABNORMAL LOW (ref 3.5–5.0)
Alkaline Phosphatase: 85 U/L (ref 38–126)
Anion gap: 9 (ref 5–15)
BUN: 15 mg/dL (ref 6–20)
CO2: 31 mmol/L (ref 22–32)
Calcium: 8.6 mg/dL — ABNORMAL LOW (ref 8.9–10.3)
Chloride: 102 mmol/L (ref 98–111)
Creatinine: 0.96 mg/dL (ref 0.44–1.00)
GFR, Est AFR Am: >60 mL/min (ref >60)
GFR, Estimated: >60 mL/min (ref >60)
Glucose, Bld: 139 mg/dL — ABNORMAL HIGH (ref 70–99)
Potassium: 3.7 mmol/L (ref 3.5–5.1)
Sodium: 142 mmol/L (ref 135–145)
Total Bilirubin: 0.2 mg/dL — ABNORMAL LOW (ref 0.3–1.2)
Total Protein: 7.3 g/dL (ref 6.5–8.1)

## 2018-09-28 LAB — PROTIME-INR
INR: 1.5 — ABNORMAL HIGH (ref 0.8–1.2)
Prothrombin Time: 17.8 seconds — ABNORMAL HIGH (ref 11.4–15.2)

## 2018-09-28 LAB — LACTATE DEHYDROGENASE: LDH: 268 U/L — ABNORMAL HIGH (ref 98–192)

## 2018-09-28 NOTE — Telephone Encounter (Signed)
Scheduled appt per 5/21 los. ° °A calendar will be mailed out. °

## 2018-10-03 ENCOUNTER — Encounter: Payer: Medicare Other | Admitting: Pharmacist

## 2018-10-04 ENCOUNTER — Ambulatory Visit: Payer: Medicare Other | Attending: Family Medicine | Admitting: Pharmacist

## 2018-10-04 ENCOUNTER — Other Ambulatory Visit: Payer: Self-pay

## 2018-10-04 DIAGNOSIS — I513 Intracardiac thrombosis, not elsewhere classified: Secondary | ICD-10-CM | POA: Diagnosis present

## 2018-10-04 DIAGNOSIS — I240 Acute coronary thrombosis not resulting in myocardial infarction: Secondary | ICD-10-CM

## 2018-10-04 LAB — POCT INR: INR: 1.8 — AB (ref 2.0–3.0)

## 2018-10-09 ENCOUNTER — Telehealth: Payer: Self-pay | Admitting: Pharmacist

## 2018-10-09 DIAGNOSIS — C9210 Chronic myeloid leukemia, BCR/ABL-positive, not having achieved remission: Secondary | ICD-10-CM

## 2018-10-09 MED ORDER — IMATINIB MESYLATE 100 MG PO TABS: 200.0000 mg | ORAL_TABLET | Freq: Every day | ORAL | 0 refills | Status: DC

## 2018-10-09 NOTE — Telephone Encounter (Signed)
Oral Oncology Pharmacist Encounter  Received call from patient's daughter, Charlotte Wyatt, with questions about next fill of [place]. Pharmacy has attempted to reach out to Charlotte Wyatt 3 times from 5/22-5/26 without success.  Refill process for Gleevec at the [facility] long outpatient pharmacy reviewed with daughter.  Next fill of [name] will ship from the [facility] long outpatient pharmacy on 10/10/2018 for delivery to daughter's home on 10/11/2018.  Daughter states that her mom is moving this week so medication will be delivered to the daughter's home this time. Alternate emergency contact for patient's mother, Charlotte Wyatt, obtained from [place].  I sent a new prescription for Gleevec 100 mg tablets, take 2 tablets (200 mg) by mouth once daily with meals and a glass of water, quantity #60, refills = 0, to the [facility] long outpatient pharmacy.  Per MD note on 09/28/2018 patient is tolerating 200 mg of Gleevec once daily.  Dose may increase to 300 mg once daily in the next month if patient continues to tolerate well.  Johny Drilling, PharmD, BCPS, BCOP  10/09/2018 9:46 AM Oral Oncology Clinic [phone]

## 2018-10-10 MED FILL — IMATINIB MESYLATE 100 MG TA: 100 | 30 days supply | Qty: 60 | Fill #0

## 2018-10-12 ENCOUNTER — Other Ambulatory Visit: Payer: Self-pay

## 2018-10-12 ENCOUNTER — Ambulatory Visit: Payer: Medicare Other | Attending: Family Medicine | Admitting: Pharmacist

## 2018-10-12 DIAGNOSIS — I513 Intracardiac thrombosis, not elsewhere classified: Secondary | ICD-10-CM | POA: Diagnosis present

## 2018-10-12 DIAGNOSIS — I240 Acute coronary thrombosis not resulting in myocardial infarction: Secondary | ICD-10-CM

## 2018-10-12 LAB — POCT INR: INR: 1.6 — AB (ref 2.0–3.0)

## 2018-10-12 MED FILL — CARVEDILOL 25 MG TABLET: 25 | 30 days supply | Qty: 60 | Fill #1

## 2018-10-19 ENCOUNTER — Encounter: Payer: Medicare Other | Admitting: Pharmacist

## 2018-10-23 ENCOUNTER — Other Ambulatory Visit: Payer: Self-pay

## 2018-10-23 ENCOUNTER — Other Ambulatory Visit: Payer: Self-pay | Admitting: Pharmacist

## 2018-10-23 ENCOUNTER — Ambulatory Visit: Payer: Medicare Other | Attending: Family Medicine | Admitting: Pharmacist

## 2018-10-23 DIAGNOSIS — I513 Intracardiac thrombosis, not elsewhere classified: Secondary | ICD-10-CM | POA: Insufficient documentation

## 2018-10-23 DIAGNOSIS — I240 Acute coronary thrombosis not resulting in myocardial infarction: Secondary | ICD-10-CM

## 2018-10-23 LAB — POCT INR: INR: 1.6 — AB (ref 2.0–3.0)

## 2018-10-23 MED ORDER — FUROSEMIDE 40 MG PO TABS: 40.0000 mg | ORAL_TABLET | Freq: Two times a day (BID) | ORAL | 0 refills | Status: DC

## 2018-10-23 MED ORDER — LISINOPRIL 40 MG PO TABS: 40.0000 mg | ORAL_TABLET | Freq: Every day | ORAL | 2 refills | Status: DC

## 2018-10-23 MED ORDER — WARFARIN SODIUM 5 MG PO TABS: ORAL_TABLET | ORAL | 1 refills | Status: DC

## 2018-10-23 MED ORDER — HYDRALAZINE HCL 25 MG PO TABS: 25.0000 mg | ORAL_TABLET | Freq: Three times a day (TID) | ORAL | 2 refills | Status: DC

## 2018-10-23 MED FILL — LISINOPRIL 40 MG TABLET: 40 | 30 days supply | Qty: 30 | Fill #0

## 2018-10-23 MED FILL — FUROSEMIDE 40 MG TAB: 40 | 30 days supply | Qty: 60 | Fill #0

## 2018-10-23 MED FILL — WARFARIN SODIUM 5 MG TABLET: 5 | 30 days supply | Qty: 60 | Fill #0

## 2018-10-23 MED FILL — hydrALAZINE HCL 25 MG TABS: 25 | 30 days supply | Qty: 90 | Fill #0

## 2018-10-26 ENCOUNTER — Other Ambulatory Visit: Payer: Self-pay | Admitting: Hematology

## 2018-10-26 DIAGNOSIS — C9210 Chronic myeloid leukemia, BCR/ABL-positive, not having achieved remission: Secondary | ICD-10-CM

## 2018-10-30 ENCOUNTER — Ambulatory Visit: Payer: Medicare Other | Attending: Family Medicine | Admitting: Pharmacist

## 2018-10-30 ENCOUNTER — Other Ambulatory Visit: Payer: Self-pay

## 2018-10-30 DIAGNOSIS — I513 Intracardiac thrombosis, not elsewhere classified: Secondary | ICD-10-CM | POA: Insufficient documentation

## 2018-10-30 DIAGNOSIS — I240 Acute coronary thrombosis not resulting in myocardial infarction: Secondary | ICD-10-CM

## 2018-10-30 LAB — POCT INR: INR: 1.7 — AB (ref 2.0–3.0)

## 2018-10-31 NOTE — Progress Notes (Signed)
HEMATOLOGY/ONCOLOGY CLINIC NOTE  Date of Service: 11/01/2018  Patient Care Team: System, Pcp Not In as PCP - General   Dr. 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 female who has been referred to [place] by Dr. Lonia Skinner for evaluation and management of her Leukocytosis. She is accompanied today by her daughter via cell phone. The pt reports that she is doing well overall.  Prior to today's visit, the pt was admitted in both early March and early April for her CHF. At both times, she was seen to have leukocytosis and a BCR-ABL was collected which was positive. She was then referred to see me.  The pt reports that she has "been good," in the last 6 months except when she has had fluid overload issues. The pt notes that her cardiac concerns are thought to be from "smoking and drinking." The pt's daughter notes that the pt previously had a concern for excessive alcohol use. The pt had a stroke in the past which her daughter notes affected the patient's memory initially. She has thus far been making medical decisions for herself.  The pt is currently living in [place], [place] but is trying to move to [place] where her daughter lives. She has been seeing a PCP, Neurology, and Cardiology in [place], and is not sure which clinic or doctors she has seen specifically. She has not established care with these specialties in [place] yet.  The pt notes that she has had weight loss recently, but is unsure how much. The pt denies abdominal pains.   Most recent lab results (08/17/18) of CBC w/diff and BMP is as follows: all values are WNL except for WBC at 43.2k, HGB at 10.5, HCT at 35.8, MCV at 74.9, MCH at 22.0, MCHC at 29.3, RDW at 23.2, nRBC at 0.8%, ANC at 29.8k, Lymphs abs at 4.3k, Monocytes abs at 1.7k, Eosinophils abs at 900, Basophils abs at 2.6k, Abs immature granulocytes  at 3.9k, Glucose at 102, Creatinine at 1.10, Calcium at 8.3, GFR at 59.  On review of systems, pt reports good energy levels, and denies abdominal pains, and any other symptoms.   On PMHx the pt reports stroke, CHF. 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 The patient's last visit with [place] was on 09/28/18. The pt reports that she is doing well overall. She is accompanied today by her daughter Charlotte Wyatt via cell phone.  The pt reports that she has been continuing on 200mg  Gleevec in the interim. She denies any problems with nausea. She notes that she has had some mild constipation but has been drinking some detox tea which has helped her move her bowels. She denies SOB or current leg swelling. She has been taking Lasix. She notes that she has not been referred to cardiology yet and hasn't seen her PCP Dr. Asencion Noble in the interim.  Lab results today (11/01/18) of CBC w/diff and CMP is as follows: all values are WNL except for WBC at 3.9k, RBC at 5.24, HGB at 11.6, MCV at 76.9, MCH at 22.1, MCHC at 28.8, RDW at 24.6, CO2 at 33, Creatinine at 1.05, Calcium at 8.7, Albumin at 3.4. 11/01/18 LDH at 181  On review of systems, pt reports eating well, and denies  SOB, leg swelling, abdominal pains, concerns for infections, and any other symptoms.    MEDICAL HISTORY:  Past Medical History:  Diagnosis Date  . CAD (coronary artery disease)   . Cardiomyopathy, secondary ([place])   . Central hearing loss   . CHF (congestive heart failure) ([place])   . Essential hypertension   . History of stroke   . Menopausal symptoms   . Mixed hyperlipidemia   . Stroke [facility])     SURGICAL HISTORY: Past Surgical History:  Procedure Laterality Date  . ATRIAL CARDIAC PACEMAKER INSERTION    . TUBAL LIGATION      SOCIAL HISTORY: Social History   Socioeconomic  History  . Marital status: Single    Spouse name: Not on file  . Number of children: 2  . Years of education: Not on file  . Highest education level: Not on file  Occupational History  . Not on file  Social Needs  . Financial resource strain: Not on file  . Food insecurity    Worry: Not on file    Inability: Not on file  . Transportation needs    Medical: Not on file    Non-medical: Not on file  Tobacco Use  . Smoking status: Current Every Day Smoker    Packs/day: 0.25  . Smokeless tobacco: Never Used  Substance and Sexual Activity  . Alcohol use: Yes    Comment: occasionally  . Drug use: Not Currently  . Sexual activity: Not on file  Lifestyle  . Physical activity    Days per week: Not on file    Minutes per session: Not on file  . Stress: Not on file  Relationships  . Social Herbalist on phone: Not on file    Gets together: Not on file    Attends religious service: Not on file    Active member of club or organization: Not on file    Attends meetings of clubs or organizations: Not on file    Relationship status: Not on file  . Intimate partner violence    Fear of current or ex partner: Not on file    Emotionally abused: Not on file    Physically abused: Not on file    Forced sexual activity: Not on file  Other Topics Concern  . Not on file  Social History Narrative  . Not on file    FAMILY HISTORY: Family History  Problem Relation Age of Onset  . Hypertension Mother   . Diabetes Father   . Hypertension Father   . Heart disease Father     ALLERGIES:  has No Known Allergies.  MEDICATIONS:  Current Outpatient Medications  Medication Sig Dispense Refill  . acetaminophen (TYLENOL) 500 MG tablet Take 1,000 mg by mouth every 6 (six) hours as needed for mild pain.    . carvedilol (COREG) 25 MG tablet Take 1 tablet (25 mg total) by mouth 2 (two) times daily with a meal. 60 tablet 6  . furosemide (LASIX) 40 MG tablet Take 1 tablet (40 mg total) by  mouth 2 (two) times daily. 60 tablet 0  . hydrALAZINE (APRESOLINE) 25 MG tablet Take 1 tablet (25 mg total) by mouth 3 (three) times daily. 90 tablet 2  . imatinib (GLEEVEC) 100 MG tablet TAKE 2 TABLETS (200 MG TOTAL) BY MOUTH DAILY. TAKE WITH MEALS AND LARGE GLASS OF WATER. 60 tablet 0  . lisinopril (ZESTRIL) 40 MG tablet Take 1 tablet (40 mg total) by mouth daily. [place]  tablet 2  . warfarin (COUMADIN) 5 MG tablet Take 5 mg tablets (1 pill) every Monday and Thursday, take 7.5 mg tablets (1.5 pills) every other day. 60 tablet 1   No current facility-administered medications for this visit.     REVIEW OF SYSTEMS:    A 10+ POINT REVIEW OF SYSTEMS WAS OBTAINED including neurology, dermatology, psychiatry, cardiac, respiratory, lymph, extremities, GI, GU, Musculoskeletal, constitutional, breasts, reproductive, HEENT.  All pertinent positives are noted in the HPI.  All others are negative.  PHYSICAL EXAMINATION: ECOG PERFORMANCE STATUS: 2 - Symptomatic, <50% confined to bed  Vitals:   11/01/18 1148  BP: (!) 147/88  Pulse: 77  Resp: 18  Temp: 98.9 F (37.2 C)  SpO2: 94%   Filed Weights   11/01/18 1148  Weight: 194 lb 9.6 oz (88.3 kg)   .Body mass index is 35.59 kg/m.  GENERAL:alert, in no acute distress and comfortable SKIN: no acute rashes, no significant lesions EYES: conjunctiva are pink and non-injected, sclera anicteric OROPHARYNX: MMM, no exudates, no oropharyngeal erythema or ulceration NECK: supple, no JVD LYMPH:  no palpable lymphadenopathy in the cervical, axillary or inguinal regions LUNGS: clear to auscultation b/l with normal respiratory effort HEART: regular rate & rhythm ABDOMEN:  normoactive bowel sounds , non tender, not distended. Palpable splenomegaly about 2 fingerbreadths beneath left costal margin Extremity: no pedal edema PSYCH: alert & oriented x 3 with fluent speech NEURO: HOH, some mild cognitive issues, no focal motor/sensory deficits   LABORATORY DATA:   I have reviewed the data as listed  . CBC Latest Ref Rng & Units 11/01/2018 09/28/2018 09/07/2018  WBC 4.0 - 10.5 K/uL 3.9(L) 8.0 68.9(HH)  Hemoglobin 12.0 - 15.0 g/dL 11.6(L) 12.4 13.3  Hematocrit 36.0 - 46.0 % 40.3 42.8 45.4  Platelets 150 - 400 K/uL 274 268 270    . CMP Latest Ref Rng & Units 11/01/2018 09/28/2018 09/07/2018  Glucose 70 - 99 mg/dL 98 139(H) 96  BUN 6 - 20 mg/dL 17 15 16   Creatinine 0.44 - 1.00 mg/dL 1.05(H) 0.96 0.93  Sodium 135 - 145 mmol/L 144 142 142  Potassium 3.5 - 5.1 mmol/L 4.3 3.7 4.3  Chloride 98 - 111 mmol/L 103 102 101  CO2 22 - 32 mmol/L 33(H) 31 30  Calcium 8.9 - 10.3 mg/dL 8.7(L) 8.6(L) 9.8  Total Protein 6.5 - 8.1 g/dL 7.3 7.3 8.2(H)  Total Bilirubin 0.3 - 1.2 mg/dL 0.3 0.2(L) 0.3  Alkaline Phos 38 - 126 U/L 101 85 99  AST 15 - 41 U/L 23 26 42(H)  ALT 0 - 44 U/L 19 25 33   08/30/18 Initial BCR-ABL:    09/04/18 BM Bx:     07/14/18 BCR-ABL:     RADIOGRAPHIC STUDIES: I have personally reviewed the radiological images as listed and agreed with the findings in the report. No results found.  ASSESSMENT & PLAN:   48 y.o. female with  1. 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 ANC at 29.8k, Lymphs at 4.3k, Monocytes at 1.7k, Eosinophils at 900, Basophils ab 2.6k and Immature grans at 3.9k  07/14/18 BCR-ABL test revealed b2a2 at 16.7455%, b3a2 at 13.1258% and E1A2 at 0.0157% Reviewed the pt's previous labs: WBC have been increasing since earliest available labs in August 2019, with increasing basophilia and anemia 07/14/18 ECHO revealed LV EF of 20-25% 09/04/18 BM Bx revealed hypercellular bone marrow with  myeloproliferative neoplasm consistent with chronic phase CML 08/30/18 Initial pre-treatment BCR-ABL revealed IS at 134.997%  PLAN -Discussed pt labwork today, 11/01/18; WBC at 3.9k, HGB at 11.6. Chemistries are stable.  LDH normalized to 181 from 732. Pt has already achieved hematologic response. -The pt has no prohibitive toxicities from continuing 200mg  Gleevec at this time. -Will refer the pt to Dr. Haroldine Laws cardiology. Would like cardiology input prior to increasing dose of Gleevec given significant cardiac history and the pt having achieved hematologic response.  -Pt does not have ability to comprehend all parts of discussion, and her daughter Charlotte Wyatt was present over the phone and is aware of the plan to continue with 200mg  Imatinib for the next month, and to call us with any concerns -Given her inability to report side effects, overall goals of care, and overall burden of medical comorbidities, will proceed with slow dose increase  -Goal to be less than 10% by 3 months -Continue follow up with PCP Dr. Asencion Noble -Continue follow up with PCP for Coumadin management -Placed a referral to Social Worker for assistance in coordinating cares given multiple barriers to care, help with setting up daughter as legal HCPOA which patient prefers. -Will see the pt back in 6 weeks   2.  Patient Active Problem List   Diagnosis Date Noted  . CML (chronic myelocytic leukemia) ([place]) 08/24/2018  . Left ventricular thrombus without MI 08/16/2018  . Leukocytosis 08/16/2018  . CHF (congestive heart failure) ([place]) 07/14/2018  . Coronary artery disease 07/14/2018  . Odontogenic infection 07/14/2018  . Essential (primary) hypertension 09/20/2017  . History of stroke 09/20/2017  - setup local PCP and cardiologist.   -RTC with Dr Irene Limbo with labs in 6 weeks -Referral to heart failure clinic -Dr Zoila Shutter in 2 weeks for mx of severe  systolic CHF -social worker referral for HCPOA    Orders Placed This Encounter  Procedures  . CBC with Differential/Platelet    Standing Status:   Future    Standing Expiration Date:   12/06/2019  . CMP ([place] only)    Standing Status:   Future    Standing Expiration Date:    11/01/2019  . BCR-ABL    Standing Status:   Future    Standing Expiration Date:   12/06/2019  . Ambulatory referral to Social Work    Referral Priority:   Routine    Referral Type:   Consultation    Referral Reason:   Specialty Services Required    Number of Visits Requested:   1  . AMB referral to CHF clinic    Referral Priority:   Routine    Referral Type:   Consultation    Referred to Provider:   Jolaine Artist, MD    Number of Visits Requested:   1     All of the patients questions were answered with apparent satisfaction. The patient knows to call the clinic with any problems, questions or concerns.  The total time spent in the appt was 25 minutes and more than 50% was on counseling and direct patient cares.    Sullivan Lone MD MS AAHIVMS [facility] Hematology/Oncology Physician [facility]  (Office):       [phone] (Work cell):  [phone] (Fax):           [phone]  11/01/2018 12:55 PM  I, Baldwin [place], am acting as a scribe for Dr. Sullivan Lone.   .I have reviewed the above documentation for accuracy and completeness, and  I agree with the above. Brunetta Genera MD

## 2018-11-01 ENCOUNTER — Inpatient Hospital Stay: Payer: Medicare Other | Attending: Hematology

## 2018-11-01 ENCOUNTER — Telehealth: Payer: Self-pay | Admitting: Hematology

## 2018-11-01 ENCOUNTER — Other Ambulatory Visit: Payer: Self-pay

## 2018-11-01 ENCOUNTER — Inpatient Hospital Stay (HOSPITAL_BASED_OUTPATIENT_CLINIC_OR_DEPARTMENT_OTHER): Payer: Medicare Other | Admitting: Hematology

## 2018-11-01 VITALS — BP 147/88 | HR 77 | Temp 98.9°F | Resp 18 | Ht 62.0 in | Wt 194.6 lb

## 2018-11-01 DIAGNOSIS — I240 Acute coronary thrombosis not resulting in myocardial infarction: Secondary | ICD-10-CM | POA: Insufficient documentation

## 2018-11-01 DIAGNOSIS — I509 Heart failure, unspecified: Secondary | ICD-10-CM

## 2018-11-01 DIAGNOSIS — F1020 Alcohol dependence, uncomplicated: Secondary | ICD-10-CM | POA: Insufficient documentation

## 2018-11-01 DIAGNOSIS — Z8673 Personal history of transient ischemic attack (TIA), and cerebral infarction without residual deficits: Secondary | ICD-10-CM

## 2018-11-01 DIAGNOSIS — Z87891 Personal history of nicotine dependence: Secondary | ICD-10-CM

## 2018-11-01 DIAGNOSIS — K5900 Constipation, unspecified: Secondary | ICD-10-CM | POA: Diagnosis not present

## 2018-11-01 DIAGNOSIS — I10 Essential (primary) hypertension: Secondary | ICD-10-CM | POA: Insufficient documentation

## 2018-11-01 DIAGNOSIS — C9210 Chronic myeloid leukemia, BCR/ABL-positive, not having achieved remission: Secondary | ICD-10-CM

## 2018-11-01 LAB — CMP (CANCER CENTER ONLY)
ALT: 19 U/L (ref 0–44)
AST: 23 U/L (ref 15–41)
Albumin: 3.4 g/dL — ABNORMAL LOW (ref 3.5–5.0)
Alkaline Phosphatase: 101 U/L (ref 38–126)
Anion gap: 8 (ref 5–15)
BUN: 17 mg/dL (ref 6–20)
CO2: 33 mmol/L — ABNORMAL HIGH (ref 22–32)
Calcium: 8.7 mg/dL — ABNORMAL LOW (ref 8.9–10.3)
Chloride: 103 mmol/L (ref 98–111)
Creatinine: 1.05 mg/dL — ABNORMAL HIGH (ref 0.44–1.00)
GFR, Est AFR Am: >60 mL/min (ref >60)
GFR, Estimated: >60 mL/min (ref >60)
Glucose, Bld: 98 mg/dL (ref 70–99)
Potassium: 4.3 mmol/L (ref 3.5–5.1)
Sodium: 144 mmol/L (ref 135–145)
Total Bilirubin: 0.3 mg/dL (ref 0.3–1.2)
Total Protein: 7.3 g/dL (ref 6.5–8.1)

## 2018-11-01 LAB — CBC WITH DIFFERENTIAL/PLATELET
Abs Immature Granulocytes: 0 10*3/uL (ref 0.00–0.07)
Basophils Absolute: 0.1 10*3/uL (ref 0.0–0.1)
Basophils Relative: 3 %
Eosinophils Absolute: 0.4 10*3/uL (ref 0.0–0.5)
Eosinophils Relative: 11 %
HCT: 40.3 % (ref 36.0–46.0)
Hemoglobin: 11.6 g/dL — ABNORMAL LOW (ref 12.0–15.0)
Immature Granulocytes: 0 %
Lymphocytes Relative: 35 %
Lymphs Abs: 1.4 10*3/uL (ref 0.7–4.0)
MCH: 22.1 pg — ABNORMAL LOW (ref 26.0–34.0)
MCHC: 28.8 g/dL — ABNORMAL LOW (ref 30.0–36.0)
MCV: 76.9 fL — ABNORMAL LOW (ref 80.0–100.0)
Monocytes Absolute: 0.2 10*3/uL (ref 0.1–1.0)
Monocytes Relative: 6 %
Neutro Abs: 1.8 10*3/uL (ref 1.7–7.7)
Neutrophils Relative %: 45 %
Platelets: 274 10*3/uL (ref 150–400)
RBC: 5.24 MIL/uL — ABNORMAL HIGH (ref 3.87–5.11)
RDW: 24.6 % — ABNORMAL HIGH (ref 11.5–15.5)
WBC: 3.9 10*3/uL — ABNORMAL LOW (ref 4.0–10.5)
nRBC: 0 % (ref 0.0–0.2)

## 2018-11-01 LAB — LACTATE DEHYDROGENASE: LDH: 181 U/L (ref 98–192)

## 2018-11-01 NOTE — Telephone Encounter (Signed)
Scheduled appt per 6/24 los. Spoke with patient daughter and she is aware of appt date and time.  Printed and mailed calendar per patient daughter request.

## 2018-11-07 ENCOUNTER — Other Ambulatory Visit: Payer: Self-pay

## 2018-11-07 ENCOUNTER — Ambulatory Visit: Payer: Medicare Other | Attending: Family Medicine | Admitting: Pharmacist

## 2018-11-07 ENCOUNTER — Encounter: Payer: Self-pay | Admitting: *Deleted

## 2018-11-07 DIAGNOSIS — I513 Intracardiac thrombosis, not elsewhere classified: Secondary | ICD-10-CM | POA: Diagnosis present

## 2018-11-07 DIAGNOSIS — I240 Acute coronary thrombosis not resulting in myocardial infarction: Secondary | ICD-10-CM

## 2018-11-07 LAB — POCT INR: INR: 2.1 (ref 2.0–3.0)

## 2018-11-07 NOTE — Progress Notes (Signed)
[place] Work  [place] representative received referral from medical oncology for [employer] and support/resources.  CSW attempted to contact patients daughter to offer support and assess for needs.  CSW left a message offering support and information on HCPOA.  CSW encouraged patient and patients daughter to call back to schedule an appointment with member of CSW team.    Johnnye Lana, MSW, LCSW, OSW-C Clinical Social Worker [place] [phone]

## 2018-11-08 ENCOUNTER — Encounter: Payer: Self-pay | Admitting: General Practice

## 2018-11-08 NOTE — Progress Notes (Signed)
[place] CSW Progress Notes  Call to patient to follow up on request to complete Adv Dir.  No answer and no VM on patient's phone.  Spoke w daughter - aware that mother has received and looked at [employer] the CSW mailed earlier.  States mother has difficulty hearing, feels best plan would be to complete these during scheduled [place] visit.  CSW has patient on schedule for 8/5 at 3 PM, paperwork can be completed at that time if patient is agreeable.  Edwyna Shell, LCSW Clinical Social Worker Phone:  [phone]

## 2018-11-09 MED FILL — IMATINIB MESYLATE 100 MG TA: 100 | 30 days supply | Qty: 60 | Fill #0

## 2018-11-21 ENCOUNTER — Other Ambulatory Visit: Payer: Self-pay

## 2018-11-21 ENCOUNTER — Ambulatory Visit: Payer: Medicare Other | Attending: Family Medicine | Admitting: Pharmacist

## 2018-11-21 DIAGNOSIS — Z7901 Long term (current) use of anticoagulants: Secondary | ICD-10-CM | POA: Insufficient documentation

## 2018-11-21 DIAGNOSIS — Z79899 Other long term (current) drug therapy: Secondary | ICD-10-CM | POA: Diagnosis not present

## 2018-11-21 DIAGNOSIS — I240 Acute coronary thrombosis not resulting in myocardial infarction: Secondary | ICD-10-CM

## 2018-11-21 DIAGNOSIS — I513 Intracardiac thrombosis, not elsewhere classified: Secondary | ICD-10-CM | POA: Diagnosis present

## 2018-11-21 LAB — POCT INR: INR: 1.8 — AB (ref 2.0–3.0)

## 2018-11-21 MED ORDER — FUROSEMIDE 40 MG PO TABS: 40.0000 mg | ORAL_TABLET | Freq: Two times a day (BID) | ORAL | 0 refills | Status: DC

## 2018-11-21 MED FILL — FUROSEMIDE 40 MG TAB: 40 | 30 days supply | Qty: 60 | Fill #0

## 2018-11-21 MED FILL — LISINOPRIL 40 MG TAB: 40 | 30 days supply | Qty: 30 | Fill #1

## 2018-11-21 MED FILL — CARVEDILOL 25 MG TABLET: 25 | 30 days supply | Qty: 60 | Fill #2

## 2018-11-21 NOTE — Addendum Note (Signed)
Addended by: Daisy Blossom, Annie Main L on: 11/21/2018 09:53 AM   Modules accepted: Orders

## 2018-12-05 ENCOUNTER — Other Ambulatory Visit: Payer: Self-pay

## 2018-12-05 ENCOUNTER — Ambulatory Visit: Payer: Medicare Other | Attending: Family Medicine | Admitting: Pharmacist

## 2018-12-05 DIAGNOSIS — I513 Intracardiac thrombosis, not elsewhere classified: Secondary | ICD-10-CM | POA: Diagnosis present

## 2018-12-05 DIAGNOSIS — I240 Acute coronary thrombosis not resulting in myocardial infarction: Secondary | ICD-10-CM

## 2018-12-05 LAB — POCT INR: INR: 2.3 (ref 2.0–3.0)

## 2018-12-05 MED FILL — WARFARIN SODIUM 5 MG TABLET: 5 | 30 days supply | Qty: 60 | Fill #1

## 2018-12-06 ENCOUNTER — Other Ambulatory Visit: Payer: Self-pay | Admitting: Hematology

## 2018-12-06 DIAGNOSIS — C9210 Chronic myeloid leukemia, BCR/ABL-positive, not having achieved remission: Secondary | ICD-10-CM

## 2018-12-07 MED FILL — IMATINIB MESYLATE 100 MG TA: 100 | 30 days supply | Qty: 60 | Fill #0

## 2018-12-12 NOTE — Progress Notes (Signed)
HEMATOLOGY/ONCOLOGY CLINIC NOTE  Date of Service: 12/13/2018  Patient Care Team: System, Pcp Not In as PCP - General   Dr. Asencion Noble as PCP  CHIEF COMPLAINTS/PURPOSE OF CONSULTATION:  Continue mx of CML  HISTORY OF PRESENTING ILLNESS:   Charlotte Wyatt is a wonderful 48 y.o. female who has been referred to [place] by Dr. Lonia Skinner for evaluation and management of her Leukocytosis. She is accompanied today by her daughter via cell phone. The pt reports that she is doing well overall.  Prior to today's visit, the pt was admitted in both early March and early April for her CHF. At both times, she was seen to have leukocytosis and a BCR-ABL was collected which was positive. She was then referred to see me.  The pt reports that she has "been good," in the last 6 months except when she has had fluid overload issues. The pt notes that her cardiac concerns are thought to be from "smoking and drinking." The pt's daughter notes that the pt previously had a concern for excessive alcohol use. The pt had a stroke in the past which her daughter notes affected the patient's memory initially. She has thus far been making medical decisions for herself.  The pt is currently living in [place], [place] but is trying to move to [place] where her daughter lives. She has been seeing a PCP, Neurology, and Cardiology in [place], and is not sure which clinic or doctors she has seen specifically. She has not established care with these specialties in [place] yet.  The pt notes that she has had weight loss recently, but is unsure how much. The pt denies abdominal pains.   Most recent lab results (08/17/18) of CBC w/diff and BMP is as follows: all values are WNL except for WBC at 43.2k, HGB at 10.5, HCT at 35.8, MCV at 74.9, MCH at 22.0, MCHC at 29.3, RDW at 23.2, nRBC at 0.8%, ANC at 29.8k, Lymphs abs at 4.3k, Monocytes abs at 1.7k, Eosinophils abs at 900, Basophils abs at 2.6k, Abs immature granulocytes at  3.9k, Glucose at 102, Creatinine at 1.10, Calcium at 8.3, GFR at 59.  On review of systems, pt reports good energy levels, and denies abdominal pains, and any other symptoms.   On PMHx the pt reports stroke, CHF. On Social Hx the pt reports previous cigarette smoking and excessive alcohol consumption  Interval History:   Charlotte Wyatt returns today for management and evaluation of her recently diagnosed CML. The patient's last visit with [place] was on 11/01/2018. The pt reports that she is doing well overall.  The pt reports that she is eating well and has occasional diarrhea. Denies leg swelling, chest pain, and belly pain. She reports that she has not seen a cardiologist. She is taking her Imatinib daily except for 3 days in July when she forgot.   Lab results today (12/13/2018) of CBC w/diff and CMP is as follows: all values are WNL except for HGB at 11.8, MCV at 78.6, MCH at 23.8, RDW at 18.7, Creatinine at 1.01. 12/13/2018 BCR-ABL is pending  On review of systems, pt reports occasional diarrhea and denies leg edema, chest pain, belly pain and any other symptoms.   MEDICAL HISTORY:  Past Medical History:  Diagnosis Date   CAD (coronary artery disease)    Cardiomyopathy, secondary ([place])    Central hearing loss    CHF (congestive heart failure) ([facility])    Essential hypertension    History of stroke  Menopausal symptoms    Mixed hyperlipidemia    Stroke [facility])     SURGICAL HISTORY: Past Surgical History:  Procedure Laterality Date   ATRIAL CARDIAC PACEMAKER INSERTION     TUBAL LIGATION      SOCIAL HISTORY: Social History   Socioeconomic History   Marital status: Single    Spouse name: Not on file   Number of children: 2   Years of education: Not on file   Highest education level: Not on file  Occupational History   Not on file  Social Needs   Financial resource strain: Not on file   Food insecurity    Worry: Not on file    Inability: Not on file     Transportation needs    Medical: Not on file    Non-medical: Not on file  Tobacco Use   Smoking status: Current Every Day Smoker    Packs/day: 0.25   Smokeless tobacco: Never Used  Substance and Sexual Activity   Alcohol use: Yes    Comment: occasionally   Drug use: Not Currently   Sexual activity: Not on file  Lifestyle   Physical activity    Days per week: Not on file    Minutes per session: Not on file   Stress: Not on file  Relationships   Social connections    Talks on phone: Not on file    Gets together: Not on file    Attends religious service: Not on file    Active member of club or organization: Not on file    Attends meetings of clubs or organizations: Not on file    Relationship status: Not on file   Intimate partner violence    Fear of current or ex partner: Not on file    Emotionally abused: Not on file    Physically abused: Not on file    Forced sexual activity: Not on file  Other Topics Concern   Not on file  Social History Narrative   Not on file    FAMILY HISTORY: Family History  Problem Relation Age of Onset   Hypertension Mother    Diabetes Father    Hypertension Father    Heart disease Father     ALLERGIES:  has No Known Allergies.  MEDICATIONS:  Current Outpatient Medications  Medication Sig Dispense Refill   acetaminophen (TYLENOL) 500 MG tablet Take 1,000 mg by mouth every 6 (six) hours as needed for mild pain.     carvedilol (COREG) 25 MG tablet Take 1 tablet (25 mg total) by mouth 2 (two) times daily with a meal. 60 tablet 6   furosemide (LASIX) 40 MG tablet Take 1 tablet (40 mg total) by mouth 2 (two) times daily. 60 tablet 0   hydrALAZINE (APRESOLINE) 25 MG tablet Take 1 tablet (25 mg total) by mouth 3 (three) times daily. 90 tablet 2   imatinib (GLEEVEC) 100 MG tablet TAKE 2 TABLETS (200 MG TOTAL) BY MOUTH DAILY. TAKE WITH MEALS AND LARGE GLASS OF WATER. 60 tablet 0   lisinopril (ZESTRIL) 40 MG tablet Take 1  tablet (40 mg total) by mouth daily. 30 tablet 2   warfarin (COUMADIN) 5 MG tablet Take 5 mg tablets (1 pill) every Monday and Thursday, take 7.5 mg tablets (1.5 pills) every other day. 60 tablet 1   No current facility-administered medications for this visit.     REVIEW OF SYSTEMS:    A 10+ POINT REVIEW OF SYSTEMS WAS OBTAINED including neurology, dermatology, psychiatry, cardiac,  respiratory, lymph, extremities, GI, GU, Musculoskeletal, constitutional, breasts, reproductive, HEENT.  All pertinent positives are noted in the HPI.  All others are negative.   PHYSICAL EXAMINATION: ECOG PERFORMANCE STATUS: 2 - Symptomatic, <50% confined to bed  Vitals:   12/13/18 1512  BP: (!) 144/92  Pulse: 79  Resp: 18  Temp: 98.7 F (37.1 C)  SpO2: 98%   Filed Weights   12/13/18 1512  Weight: 200 lb 9.6 oz (91 kg)   .Body mass index is 36.69 kg/m.  GENERAL: alert, in no acute distress and comfortable, HOH SKIN: no acute rashes, no significant lesions EYES: conjunctiva are pink and non-injected, sclera anicteric OROPHARYNX: MMM, no exudates, no oropharyngeal erythema or ulceration NECK: supple, no JVD LYMPH:  no palpable lymphadenopathy in the cervical, axillary or inguinal regions LUNGS: clear to auscultation b/l with normal respiratory effort HEART: regular rate & rhythm ABDOMEN:  normoactive bowel sounds , non tender, not distended. Splenomegaly has improved Extremity: no pedal edema PSYCH: alert & oriented x 3 with fluent speech NEURO: some mild cognitive issues, no focal motor/sensory deficits   LABORATORY DATA:  I have reviewed the data as listed  . CBC Latest Ref Rng & Units 12/13/2018 11/01/2018 09/28/2018  WBC 4.0 - 10.5 K/uL 6.5 3.9(L) 8.0  Hemoglobin 12.0 - 15.0 g/dL 11.8(L) 11.6(L) 12.4  Hematocrit 36.0 - 46.0 % 38.9 40.3 42.8  Platelets 150 - 400 K/uL 234 274 268    . CMP Latest Ref Rng & Units 12/13/2018 11/01/2018 09/28/2018  Glucose 70 - 99 mg/dL 89 98 139(H)  BUN 6 -  20 mg/dL 16 17 15   Creatinine 0.44 - 1.00 mg/dL 1.01(H) 1.05(H) 0.96  Sodium 135 - 145 mmol/L 143 144 142  Potassium 3.5 - 5.1 mmol/L 4.0 4.3 3.7  Chloride 98 - 111 mmol/L 101 103 102  CO2 22 - 32 mmol/L 32 33(H) 31  Calcium 8.9 - 10.3 mg/dL 9.4 8.7(L) 8.6(L)  Total Protein 6.5 - 8.1 g/dL 7.3 7.3 7.3  Total Bilirubin 0.3 - 1.2 mg/dL 0.3 0.3 0.2(L)  Alkaline Phos 38 - 126 U/L 86 101 85  AST 15 - 41 U/L 23 23 26   ALT 0 - 44 U/L 18 19 25    08/30/18 Initial BCR-ABL:    09/04/18 BM Bx:     07/14/18 BCR-ABL:     RADIOGRAPHIC STUDIES: I have personally reviewed the radiological images as listed and agreed with the findings in the report. No results found.  ASSESSMENT & PLAN:   48 y.o. female with  1. Recently diagnosed Chronic Myeloid Leukemia  Labs upon initial presentation from 08/17/18, WBC elevated at 43.2k, anemic with HGB at 10.5, PLT normal at 229k. ANC at 29.8k, Lymphs at 4.3k, Monocytes at 1.7k, Eosinophils at 900, Basophils ab 2.6k and Immature grans at 3.9k  07/14/18 BCR-ABL test revealed b2a2 at 16.7455%, b3a2 at 13.1258% and E1A2 at 0.0157% Reviewed the pt's previous labs: WBC have been increasing since earliest available labs in August 2019, with increasing basophilia and anemia 07/14/18 ECHO revealed LV EF of 20-25% 09/04/18 BM Bx revealed hypercellular bone marrow with myeloproliferative neoplasm consistent with chronic phase CML 08/30/18 Initial pre-treatment BCR-ABL revealed IS at 134.997%  PLAN -Discussed pt labwork today, 12/13/2018; WBC are normal, PLT are normal, blood chemistries are stable, 12/13/2018 BCR-ABL is pending -appears to be in hematologic remission. -The pt has no prohibitive toxicities from continuing 200mg  Imatinib at this time. -Will refer the pt again to Dr. Haroldine Laws cardiology. Would like cardiology input prior to  increasing dose of Imatinib given significant cardiac history and the pt having achieved hematologic response.  -Given her inability to  report side effects accurately, overall goals of care, and overall burden of medical comorbidities, will proceed with slow dose increase  -Goal to be less than 10% by 3 months -Continue follow up with PCP Dr. Asencion Noble -Continue follow up with PCP for Coumadin management -Will see the pt back in 2 months   2.  Patient Active Problem List   Diagnosis Date Noted   CML (chronic myelocytic leukemia) ([place]) 08/24/2018   Left ventricular thrombus without MI 08/16/2018   Leukocytosis 08/16/2018   CHF (congestive heart failure) ([place]) 07/14/2018   Coronary artery disease 07/14/2018   Odontogenic infection 07/14/2018   Essential (primary) hypertension 09/20/2017   History of stroke 09/20/2017  - setup local PCP and cardiologist.   RTC with Dr Irene Limbo with labs in 2 months   No orders of the defined types were placed in this encounter.   All of the patients questions were answered with apparent satisfaction. The patient knows to call the clinic with any problems, questions or concerns.  The total time spent in the appt was 25 minutes and more than 50% was on counseling and direct patient cares.   Sullivan Lone MD MS AAHIVMS [facility] Hematology/Oncology Physician [facility]  (Office):       [phone] (Work cell):  [phone] (Fax):           [phone]  12/13/2018 3:59 PM  I, De Burrs, am acting as a scribe for Dr. Irene Limbo  .I have reviewed the above documentation for accuracy and completeness, and I agree with the above. Brunetta Genera MD

## 2018-12-13 ENCOUNTER — Encounter: Payer: Self-pay | Admitting: General Practice

## 2018-12-13 ENCOUNTER — Encounter: Payer: Self-pay | Admitting: *Deleted

## 2018-12-13 ENCOUNTER — Other Ambulatory Visit: Payer: Self-pay

## 2018-12-13 ENCOUNTER — Inpatient Hospital Stay (HOSPITAL_BASED_OUTPATIENT_CLINIC_OR_DEPARTMENT_OTHER): Payer: Medicare Other | Admitting: Hematology

## 2018-12-13 ENCOUNTER — Inpatient Hospital Stay: Payer: Medicare Other | Admitting: General Practice

## 2018-12-13 ENCOUNTER — Inpatient Hospital Stay: Payer: Medicare Other | Attending: Hematology

## 2018-12-13 VITALS — BP 144/92 | HR 79 | Temp 98.7°F | Resp 18 | Ht 62.0 in | Wt 200.6 lb

## 2018-12-13 DIAGNOSIS — I2510 Atherosclerotic heart disease of native coronary artery without angina pectoris: Secondary | ICD-10-CM | POA: Insufficient documentation

## 2018-12-13 DIAGNOSIS — Z8249 Family history of ischemic heart disease and other diseases of the circulatory system: Secondary | ICD-10-CM | POA: Insufficient documentation

## 2018-12-13 DIAGNOSIS — I10 Essential (primary) hypertension: Secondary | ICD-10-CM | POA: Insufficient documentation

## 2018-12-13 DIAGNOSIS — F17210 Nicotine dependence, cigarettes, uncomplicated: Secondary | ICD-10-CM | POA: Insufficient documentation

## 2018-12-13 DIAGNOSIS — E8770 Fluid overload, unspecified: Secondary | ICD-10-CM | POA: Insufficient documentation

## 2018-12-13 DIAGNOSIS — Z79899 Other long term (current) drug therapy: Secondary | ICD-10-CM | POA: Diagnosis not present

## 2018-12-13 DIAGNOSIS — C9210 Chronic myeloid leukemia, BCR/ABL-positive, not having achieved remission: Secondary | ICD-10-CM

## 2018-12-13 DIAGNOSIS — Z833 Family history of diabetes mellitus: Secondary | ICD-10-CM | POA: Insufficient documentation

## 2018-12-13 DIAGNOSIS — Z8673 Personal history of transient ischemic attack (TIA), and cerebral infarction without residual deficits: Secondary | ICD-10-CM | POA: Insufficient documentation

## 2018-12-13 DIAGNOSIS — I509 Heart failure, unspecified: Secondary | ICD-10-CM | POA: Insufficient documentation

## 2018-12-13 LAB — CMP (CANCER CENTER ONLY)
ALT: 18 U/L (ref 0–44)
AST: 23 U/L (ref 15–41)
Albumin: 3.5 g/dL (ref 3.5–5.0)
Alkaline Phosphatase: 86 U/L (ref 38–126)
Anion gap: 10 (ref 5–15)
BUN: 16 mg/dL (ref 6–20)
CO2: 32 mmol/L (ref 22–32)
Calcium: 9.4 mg/dL (ref 8.9–10.3)
Chloride: 101 mmol/L (ref 98–111)
Creatinine: 1.01 mg/dL — ABNORMAL HIGH (ref 0.44–1.00)
GFR, Est AFR Am: >60 mL/min (ref >60)
GFR, Estimated: >60 mL/min (ref >60)
Glucose, Bld: 89 mg/dL (ref 70–99)
Potassium: 4 mmol/L (ref 3.5–5.1)
Sodium: 143 mmol/L (ref 135–145)
Total Bilirubin: 0.3 mg/dL (ref 0.3–1.2)
Total Protein: 7.3 g/dL (ref 6.5–8.1)

## 2018-12-13 LAB — CBC WITH DIFFERENTIAL/PLATELET
Abs Immature Granulocytes: 0.01 10*3/uL (ref 0.00–0.07)
Basophils Absolute: 0.1 10*3/uL (ref 0.0–0.1)
Basophils Relative: 1 %
Eosinophils Absolute: 0.5 10*3/uL (ref 0.0–0.5)
Eosinophils Relative: 8 %
HCT: 38.9 % (ref 36.0–46.0)
Hemoglobin: 11.8 g/dL — ABNORMAL LOW (ref 12.0–15.0)
Immature Granulocytes: 0 %
Lymphocytes Relative: 32 %
Lymphs Abs: 2.1 10*3/uL (ref 0.7–4.0)
MCH: 23.8 pg — ABNORMAL LOW (ref 26.0–34.0)
MCHC: 30.3 g/dL (ref 30.0–36.0)
MCV: 78.6 fL — ABNORMAL LOW (ref 80.0–100.0)
Monocytes Absolute: 0.5 10*3/uL (ref 0.1–1.0)
Monocytes Relative: 7 %
Neutro Abs: 3.4 10*3/uL (ref 1.7–7.7)
Neutrophils Relative %: 52 %
Platelets: 234 10*3/uL (ref 150–400)
RBC: 4.95 MIL/uL (ref 3.87–5.11)
RDW: 18.7 % — ABNORMAL HIGH (ref 11.5–15.5)
WBC: 6.5 10*3/uL (ref 4.0–10.5)
nRBC: 0 % (ref 0.0–0.2)

## 2018-12-13 NOTE — Progress Notes (Signed)
[facility] CSW Progress Notes  CSW had scheduled appt to meet w patient re Advance Directives at today's appointment.  However, current [employer] law requires that these be signed in presence of two non family non employee witnesses.  [facility] cannot meet these requirements due to COVID restrictions.  CSW Dalene Seltzer (on site today) asked to provide patient w copy of Adv Dir so she can review and possibly complete in community.  Will recontact once COVID restrictions are lifted.  Edwyna Shell, LCSW Clinical Social Worker Phone:  [phone]

## 2018-12-13 NOTE — Progress Notes (Signed)
[facility] Work  Clinical Social Work provided advance directives booklet and information on covid restrictions.  Provided contact information and encouraged patient/family to call with questions or concerns.   Johnnye Lana, MSW, LCSW, OSW-C Clinical Social Worker [facility] [phone]

## 2018-12-14 ENCOUNTER — Telehealth: Payer: Self-pay | Admitting: Hematology

## 2018-12-14 NOTE — Telephone Encounter (Signed)
Scheduled appt per 8/5 los.  Printed and mailed appt calendar.

## 2018-12-26 ENCOUNTER — Other Ambulatory Visit: Payer: Self-pay

## 2018-12-26 ENCOUNTER — Ambulatory Visit: Payer: Medicare Other | Attending: Family Medicine | Admitting: Pharmacist

## 2018-12-26 DIAGNOSIS — I240 Acute coronary thrombosis not resulting in myocardial infarction: Secondary | ICD-10-CM

## 2018-12-26 DIAGNOSIS — I513 Intracardiac thrombosis, not elsewhere classified: Secondary | ICD-10-CM | POA: Insufficient documentation

## 2018-12-26 LAB — POCT INR: INR: 3 (ref 2.0–3.0)

## 2018-12-28 LAB — BCR/ABL

## 2018-12-29 ENCOUNTER — Ambulatory Visit: Payer: Medicare Other | Attending: Nurse Practitioner | Admitting: Nurse Practitioner

## 2018-12-29 ENCOUNTER — Other Ambulatory Visit: Payer: Self-pay

## 2019-01-02 ENCOUNTER — Other Ambulatory Visit: Payer: Self-pay | Admitting: Hematology

## 2019-01-02 DIAGNOSIS — C9210 Chronic myeloid leukemia, BCR/ABL-positive, not having achieved remission: Secondary | ICD-10-CM

## 2019-01-04 MED FILL — IMATINIB MESYLATE 100 MG TA: 100 | 30 days supply | Qty: 60 | Fill #0

## 2019-01-17 ENCOUNTER — Other Ambulatory Visit: Payer: Self-pay | Admitting: Critical Care Medicine

## 2019-01-17 MED FILL — LISINOPRIL 40 MG TABLET: 40 | 30 days supply | Qty: 30 | Fill #2

## 2019-01-17 MED FILL — WARFARIN SODIUM 5 MG TABLET: 5 | 30 days supply | Qty: 60 | Fill #1

## 2019-01-17 MED FILL — FUROSEMIDE 40 MG TAB: 40 | 30 days supply | Qty: 60 | Fill #0

## 2019-01-17 MED FILL — hydrALAZINE HCL 25 MG TABS: 25 | 30 days supply | Qty: 90 | Fill #1

## 2019-01-17 MED FILL — CARVEDILOL 25 MG TABLET: 25 | 30 days supply | Qty: 60 | Fill #3

## 2019-01-23 ENCOUNTER — Other Ambulatory Visit: Payer: Self-pay

## 2019-01-23 ENCOUNTER — Ambulatory Visit: Payer: Medicare Other | Attending: Family Medicine | Admitting: Pharmacist

## 2019-01-23 DIAGNOSIS — I513 Intracardiac thrombosis, not elsewhere classified: Secondary | ICD-10-CM | POA: Diagnosis present

## 2019-01-23 DIAGNOSIS — I240 Acute coronary thrombosis not resulting in myocardial infarction: Secondary | ICD-10-CM

## 2019-01-23 LAB — POCT INR: INR: 1.2 — AB (ref 2.0–3.0)

## 2019-01-30 ENCOUNTER — Ambulatory Visit: Payer: Medicare Other | Attending: Family Medicine | Admitting: Pharmacist

## 2019-01-30 ENCOUNTER — Other Ambulatory Visit: Payer: Self-pay

## 2019-01-30 DIAGNOSIS — Z7901 Long term (current) use of anticoagulants: Secondary | ICD-10-CM | POA: Insufficient documentation

## 2019-01-30 DIAGNOSIS — I513 Intracardiac thrombosis, not elsewhere classified: Secondary | ICD-10-CM | POA: Diagnosis present

## 2019-01-30 DIAGNOSIS — I240 Acute coronary thrombosis not resulting in myocardial infarction: Secondary | ICD-10-CM

## 2019-01-30 LAB — POCT INR: INR: 2.4 (ref 2.0–3.0)

## 2019-01-31 ENCOUNTER — Other Ambulatory Visit: Payer: Self-pay | Admitting: Hematology

## 2019-01-31 DIAGNOSIS — C9210 Chronic myeloid leukemia, BCR/ABL-positive, not having achieved remission: Secondary | ICD-10-CM

## 2019-02-07 MED FILL — IMATINIB MESYLATE 100 MG TA: 100 | 30 days supply | Qty: 60 | Fill #0

## 2019-02-12 ENCOUNTER — Other Ambulatory Visit: Payer: Self-pay

## 2019-02-12 ENCOUNTER — Ambulatory Visit: Payer: Medicare Other | Attending: Nurse Practitioner | Admitting: Nurse Practitioner

## 2019-02-12 ENCOUNTER — Other Ambulatory Visit: Payer: Self-pay | Admitting: *Deleted

## 2019-02-12 DIAGNOSIS — C9210 Chronic myeloid leukemia, BCR/ABL-positive, not having achieved remission: Secondary | ICD-10-CM

## 2019-02-12 NOTE — Progress Notes (Signed)
HEMATOLOGY/ONCOLOGY CLINIC NOTE  Date of Service: 02/13/2019  Patient Care Team: System, Pcp Not In as PCP - General   Dr. Asencion Noble as PCP  CHIEF COMPLAINTS/PURPOSE OF CONSULTATION:  Continue mx of CML  HISTORY OF PRESENTING ILLNESS:   Charlotte Wyatt is a wonderful 48 y.o. female who has been referred to [place] by Dr. Lonia Skinner for evaluation and management of her Leukocytosis. She is accompanied today by her daughter via cell phone. The pt reports that she is doing well overall.  Prior to today's visit, the pt was admitted in both early March and early April for her CHF. At both times, she was seen to have leukocytosis and a BCR-ABL was collected which was positive. She was then referred to see me.  The pt reports that she has "been good," in the last 6 months except when she has had fluid overload issues. The pt notes that her cardiac concerns are thought to be from "smoking and drinking." The pt's daughter notes that the pt previously had a concern for excessive alcohol use. The pt had a stroke in the past which her daughter notes affected the patient's memory initially. She has thus far been making medical decisions for herself.  The pt is currently living in [place], [place] but is trying to move to [place] where her daughter lives. She has been seeing a PCP, Neurology, and Cardiology in [place], and is not sure which clinic or doctors she has seen specifically. She has not established care with these specialties in [place] yet.  The pt notes that she has had weight loss recently, but is unsure how much. The pt denies abdominal pains.   Most recent lab results (08/17/18) of CBC w/diff and BMP is as follows: all values are WNL except for WBC at 43.2k, HGB at 10.5, HCT at 35.8, MCV at 74.9, MCH at 22.0, MCHC at 29.3, RDW at 23.2, nRBC at 0.8%, ANC at 29.8k, Lymphs abs at 4.3k, Monocytes abs at 1.7k, Eosinophils abs at 900, Basophils abs at 2.6k, Abs immature granulocytes at  3.9k, Glucose at 102, Creatinine at 1.10, Calcium at 8.3, GFR at 59.  On review of systems, pt reports good energy levels, and denies abdominal pains, and any other symptoms.   On PMHx the pt reports stroke, CHF. On Social Hx the pt reports previous cigarette smoking and excessive alcohol consumption  Interval History:   Charlotte Wyatt returns today for management and evaluation of her recently diagnosed CML. We are joined by the pt's daughter via phone. The patient's last visit with [place] was on 12/13/2018. The pt reports that she is doing well overall.  The pt reports that she has been feeling okay and her breathing has been alright. Pt denies leg swelling, headaches, SOB, chest pains. She reports that she has been eating well, if not too much. She is currently living with her mother and her daughter is her primary caregiver.   Pt has seen Dr. Asencion Noble only one time. Her daughter states that she was supposed to be followed up by another physician at the same office. Pt still goes to [facility] where someone manages her Coumadin shots. She has not set up care with a PCP, Cardiologist or a Neurologist yet. She was supposed to see another physician yesterday but was told that she would have to have a telehealth visit. She can not tell us who the physician was.   Lab results today (02/13/19) of  CBC w/diff and CMP is as follows: all values are WNL except for RBC at 5.18, MCH at 24.1, MCHC at 29.9, RDW at 17.2, Eosinophils Absolute at 0.7K, CO2 at 34, Glucose at 129, Creatinine at 1.02, Albumin at 3.4, Total Bilirubin at <0.2. 02/13/2019 LDH at 195  On review of systems, pt denies abdominal pain, leg swelling, headaches, SOB, chest pains and any other symptoms.   MEDICAL HISTORY:  Past Medical History:  Diagnosis Date  . CAD (coronary artery disease)   . Cardiomyopathy, secondary ([place])   . Central hearing loss   . CHF (congestive heart failure) ([place])   .  Essential hypertension   . History of stroke   . Menopausal symptoms   . Mixed hyperlipidemia   . Stroke [facility])     SURGICAL HISTORY: Past Surgical History:  Procedure Laterality Date  . ATRIAL CARDIAC PACEMAKER INSERTION    . TUBAL LIGATION      SOCIAL HISTORY: Social History   Socioeconomic History  . Marital status: Single    Spouse name: Not on file  . Number of children: 2  . Years of education: Not on file  . Highest education level: Not on file  Occupational History  . Not on file  Social Needs  . Financial resource strain: Not on file  . Food insecurity    Worry: Not on file    Inability: Not on file  . Transportation needs    Medical: Not on file    Non-medical: Not on file  Tobacco Use  . Smoking status: Current Every Day Smoker    Packs/day: 0.25  . Smokeless tobacco: Never Used  Substance and Sexual Activity  . Alcohol use: Yes    Comment: occasionally  . Drug use: Not Currently  . Sexual activity: Not on file  Lifestyle  . Physical activity    Days per week: Not on file    Minutes per session: Not on file  . Stress: Not on file  Relationships  . Social Herbalist on phone: Not on file    Gets together: Not on file    Attends religious service: Not on file    Active member of club or organization: Not on file    Attends meetings of clubs or organizations: Not on file    Relationship status: Not on file  . Intimate partner violence    Fear of current or ex partner: Not on file    Emotionally abused: Not on file    Physically abused: Not on file    Forced sexual activity: Not on file  Other Topics Concern  . Not on file  Social History Narrative  . Not on file    FAMILY HISTORY: Family History  Problem Relation Age of Onset  . Hypertension Mother   . Diabetes Father   . Hypertension Father   . Heart disease Father     ALLERGIES:  has No Known Allergies.  MEDICATIONS:  Current Outpatient Medications  Medication Sig  Dispense Refill  . acetaminophen (TYLENOL) 500 MG tablet Take 1,000 mg by mouth every 6 (six) hours as needed for mild pain.    . carvedilol (COREG) 25 MG tablet Take 1 tablet (25 mg total) by mouth 2 (two) times daily with a meal. 60 tablet 6  . furosemide (LASIX) 40 MG tablet Take 1 tablet (40 mg total) by mouth 2 (two) times daily. Must have office visit for refills 60 tablet 0  . hydrALAZINE (APRESOLINE) 25  MG tablet Take 1 tablet (25 mg total) by mouth 3 (three) times daily. 90 tablet 2  . imatinib (GLEEVEC) 100 MG tablet TAKE 2 TABLETS (200 MG TOTAL) BY MOUTH DAILY. TAKE WITH MEALS AND LARGE GLASS OF WATER. 60 tablet 0  . lisinopril (ZESTRIL) 40 MG tablet Take 1 tablet (40 mg total) by mouth daily. 30 tablet 2  . warfarin (COUMADIN) 5 MG tablet Take 5 mg tablets (1 pill) every Monday and Thursday, take 7.5 mg tablets (1.5 pills) every other day. 60 tablet 1   No current facility-administered medications for this visit.     REVIEW OF SYSTEMS:    A 10+ POINT REVIEW OF SYSTEMS WAS OBTAINED including neurology, dermatology, psychiatry, cardiac, respiratory, lymph, extremities, GI, GU, Musculoskeletal, constitutional, breasts, reproductive, HEENT.  All pertinent positives are noted in the HPI.  All others are negative.    PHYSICAL EXAMINATION: ECOG PERFORMANCE STATUS: 2 - Symptomatic, <50% confined to bed  Vitals:   02/13/19 1357  BP: 136/83  Pulse: 84  Resp: 18  Temp: 98.3 F (36.8 C)  SpO2: 97%   Filed Weights   02/13/19 1357  Weight: 209 lb 4.8 oz (94.9 kg)   .Body mass index is 38.28 kg/m.  GENERAL:alert, in no acute distress and comfortable, HOH SKIN: no acute rashes, no significant lesions EYES: conjunctiva are pink and non-injected, sclera anicteric OROPHARYNX: MMM, no exudates, no oropharyngeal erythema or ulceration NECK: supple, no JVD LYMPH:  no palpable lymphadenopathy in the cervical, axillary or inguinal regions LUNGS: clear to auscultation b/l with normal  respiratory effort HEART: regular rate & rhythm ABDOMEN:  normoactive bowel sounds , non tender, not distended. No palpable hepatosplenomegaly.  Extremity: no pedal edema PSYCH: alert & oriented x 3 with fluent speech NEURO: some mild cognitive issues  LABORATORY DATA:  I have reviewed the data as listed  . CBC Latest Ref Rng & Units 02/13/2019 12/13/2018 11/01/2018  WBC 4.0 - 10.5 K/uL 6.1 6.5 3.9(L)  Hemoglobin 12.0 - 15.0 g/dL 12.5 11.8(L) 11.6(L)  Hematocrit 36.0 - 46.0 % 41.8 38.9 40.3  Platelets 150 - 400 K/uL 255 234 274    . CMP Latest Ref Rng & Units 02/13/2019 12/13/2018 11/01/2018  Glucose 70 - 99 mg/dL 129(H) 89 98  BUN 6 - 20 mg/dL 16 16 17   Creatinine 0.44 - 1.00 mg/dL 1.02(H) 1.01(H) 1.05(H)  Sodium 135 - 145 mmol/L 144 143 144  Potassium 3.5 - 5.1 mmol/L 4.2 4.0 4.3  Chloride 98 - 111 mmol/L 102 101 103  CO2 22 - 32 mmol/L 34(H) 32 33(H)  Calcium 8.9 - 10.3 mg/dL 9.2 9.4 8.7(L)  Total Protein 6.5 - 8.1 g/dL 7.2 7.3 7.3  Total Bilirubin 0.3 - 1.2 mg/dL <0.2(L) 0.3 0.3  Alkaline Phos 38 - 126 U/L 86 86 101  AST 15 - 41 U/L 21 23 23   ALT 0 - 44 U/L 16 18 19    12/13/2018 BCR-ABL:   08/30/18 Initial BCR-ABL:    09/04/18 BM Bx:     07/14/18 BCR-ABL:     RADIOGRAPHIC STUDIES: I have personally reviewed the radiological images as listed and agreed with the findings in the report. No results found.  ASSESSMENT & PLAN:   48 y.o. female with  1. Recently diagnosed Chronic Myeloid Leukemia  Labs upon initial presentation from 08/17/18, WBC elevated at 43.2k, anemic with HGB at 10.5, PLT normal at 229k. ANC at 29.8k, Lymphs at 4.3k, Monocytes at 1.7k, Eosinophils at 900, Basophils ab 2.6k and Immature grans  at 3.9k  07/14/18 BCR-ABL test revealed b2a2 at 16.7455%, b3a2 at 13.1258% and E1A2 at 0.0157% Reviewed the pt's previous labs: WBC have been increasing since earliest available labs in August 2019, with increasing basophilia and anemia 07/14/18 ECHO revealed LV EF  of 20-25% 09/04/18 BM Bx revealed hypercellular bone marrow with myeloproliferative neoplasm consistent with chronic phase CML 08/30/18 Initial pre-treatment BCR-ABL revealed IS at 134.997% 12/13/2018 BCR-ABL revealed IS at 5.9101%  PLAN: -Discussed pt labwork today, 02/13/19; all values are WNL except for RBC at 5.18, MCH at 24.1, MCHC at 29.9, RDW at 17.2, Eosinophils Absolute at 0.7K, CO2 at 34, Glucose at 129, Creatinine at 1.02, Albumin at 3.4, Total Bilirubin at <0.2. 02/13/2019 LDH at 195 -Pt appears to be in hematologic remission. -The pt has no prohibitive toxicities from continuing 200mg  Imatinib at this time. -Would like cardiology input prior to increasing dose of Imatinib given significant cardiac history and the pt having achieved hematologic response.  -Given her inability to report side effects accurately, overall goals of care, and overall burden of medical comorbidities, will proceed with slow dose increase  -Goal BCR-ABL to be less than 10% by 3 months, 12/13/2018 BCR-ABL at 5.9101% -Continue follow up at [facility] for Coumadin management -Will contact social services to help manage pt's care  -Will see back 2 months with labs      2.  Patient Active Problem List   Diagnosis Date Noted  . CML (chronic myelocytic leukemia) ([place]) 08/24/2018  . Left ventricular thrombus without MI ([place]) 08/16/2018  . Leukocytosis 08/16/2018  . CHF (congestive heart failure) ([place]) 07/14/2018  . Coronary artery disease 07/14/2018  . Odontogenic infection 07/14/2018  . Essential (primary) hypertension 09/20/2017  . History of stroke 09/20/2017  - setup local PCP and cardiologist.   FOLLOW UP: RTC with Dr Irene Limbo with labs in 2 months  Orders Placed This Encounter  Procedures  . CBC with Differential/Platelet    Standing Status:   Future    Standing Expiration Date:   03/19/2020  . CMP ([place] only)    Standing Status:   Future    Standing  Expiration Date:   02/13/2020    The total time spent in the appt was 20 minutes and more than 50% was on counseling and direct patient cares.  All of the patient's questions were answered with apparent satisfaction. The patient knows to call the clinic with any problems, questions or concerns.    Sullivan Lone MD [place] AAHIVMS [facility] Hematology/Oncology Physician [facility]  (Office):       [phone] (Work cell):  [phone] (Fax):           [phone]  02/13/2019 4:59 PM  I, Yevette Edwards, am acting as a scribe for Dr. Sullivan Lone.   .I have reviewed the above documentation for accuracy and completeness, and I agree with the above. Brunetta Genera MD

## 2019-02-13 ENCOUNTER — Other Ambulatory Visit: Payer: Self-pay

## 2019-02-13 ENCOUNTER — Inpatient Hospital Stay: Payer: Medicare Other | Attending: Hematology

## 2019-02-13 ENCOUNTER — Inpatient Hospital Stay (HOSPITAL_BASED_OUTPATIENT_CLINIC_OR_DEPARTMENT_OTHER): Payer: Medicare Other | Admitting: Hematology

## 2019-02-13 ENCOUNTER — Encounter: Payer: Self-pay | Admitting: General Practice

## 2019-02-13 VITALS — BP 136/83 | HR 84 | Temp 98.3°F | Resp 18 | Ht 62.0 in | Wt 209.3 lb

## 2019-02-13 DIAGNOSIS — Z79899 Other long term (current) drug therapy: Secondary | ICD-10-CM | POA: Diagnosis not present

## 2019-02-13 DIAGNOSIS — F17210 Nicotine dependence, cigarettes, uncomplicated: Secondary | ICD-10-CM | POA: Diagnosis not present

## 2019-02-13 DIAGNOSIS — E782 Mixed hyperlipidemia: Secondary | ICD-10-CM | POA: Insufficient documentation

## 2019-02-13 DIAGNOSIS — I509 Heart failure, unspecified: Secondary | ICD-10-CM | POA: Insufficient documentation

## 2019-02-13 DIAGNOSIS — I2510 Atherosclerotic heart disease of native coronary artery without angina pectoris: Secondary | ICD-10-CM

## 2019-02-13 DIAGNOSIS — Z7901 Long term (current) use of anticoagulants: Secondary | ICD-10-CM | POA: Insufficient documentation

## 2019-02-13 DIAGNOSIS — Z950 Presence of cardiac pacemaker: Secondary | ICD-10-CM | POA: Diagnosis not present

## 2019-02-13 DIAGNOSIS — C9210 Chronic myeloid leukemia, BCR/ABL-positive, not having achieved remission: Secondary | ICD-10-CM | POA: Diagnosis present

## 2019-02-13 DIAGNOSIS — Z8673 Personal history of transient ischemic attack (TIA), and cerebral infarction without residual deficits: Secondary | ICD-10-CM | POA: Insufficient documentation

## 2019-02-13 LAB — CMP (CANCER CENTER ONLY)
ALT: 16 U/L (ref 0–44)
AST: 21 U/L (ref 15–41)
Albumin: 3.4 g/dL — ABNORMAL LOW (ref 3.5–5.0)
Alkaline Phosphatase: 86 U/L (ref 38–126)
Anion gap: 8 (ref 5–15)
BUN: 16 mg/dL (ref 6–20)
CO2: 34 mmol/L — ABNORMAL HIGH (ref 22–32)
Calcium: 9.2 mg/dL (ref 8.9–10.3)
Chloride: 102 mmol/L (ref 98–111)
Creatinine: 1.02 mg/dL — ABNORMAL HIGH (ref 0.44–1.00)
GFR, Est AFR Am: >60 mL/min (ref >60)
GFR, Estimated: >60 mL/min (ref >60)
Glucose, Bld: 129 mg/dL — ABNORMAL HIGH (ref 70–99)
Potassium: 4.2 mmol/L (ref 3.5–5.1)
Sodium: 144 mmol/L (ref 135–145)
Total Bilirubin: <0.2 mg/dL — ABNORMAL LOW (ref 0.3–1.2)
Total Protein: 7.2 g/dL (ref 6.5–8.1)

## 2019-02-13 LAB — CBC WITH DIFFERENTIAL (CANCER CENTER ONLY)
Abs Immature Granulocytes: 0.01 10*3/uL (ref 0.00–0.07)
Basophils Absolute: 0 10*3/uL (ref 0.0–0.1)
Basophils Relative: 1 %
Eosinophils Absolute: 0.7 10*3/uL — ABNORMAL HIGH (ref 0.0–0.5)
Eosinophils Relative: 11 %
HCT: 41.8 % (ref 36.0–46.0)
Hemoglobin: 12.5 g/dL (ref 12.0–15.0)
Immature Granulocytes: 0 %
Lymphocytes Relative: 33 %
Lymphs Abs: 2 10*3/uL (ref 0.7–4.0)
MCH: 24.1 pg — ABNORMAL LOW (ref 26.0–34.0)
MCHC: 29.9 g/dL — ABNORMAL LOW (ref 30.0–36.0)
MCV: 80.7 fL (ref 80.0–100.0)
Monocytes Absolute: 0.4 10*3/uL (ref 0.1–1.0)
Monocytes Relative: 6 %
Neutro Abs: 3 10*3/uL (ref 1.7–7.7)
Neutrophils Relative %: 49 %
Platelet Count: 255 10*3/uL (ref 150–400)
RBC: 5.18 MIL/uL — ABNORMAL HIGH (ref 3.87–5.11)
RDW: 17.2 % — ABNORMAL HIGH (ref 11.5–15.5)
WBC Count: 6.1 10*3/uL (ref 4.0–10.5)
nRBC: 0 % (ref 0.0–0.2)

## 2019-02-13 LAB — LACTATE DEHYDROGENASE: LDH: 195 U/L — ABNORMAL HIGH (ref 98–192)

## 2019-02-13 NOTE — Progress Notes (Signed)
[place] CSW Progress Notes  Message from Erskine Emery - patient needs PCP, cardiology and neurology referrals per oncologist.  Per chart, patient has been seen at [facility] for primary care. Message sent to Eden Lathe RN CM at this facility to clarify is she is a patient there and who her PCP is.    Edwyna Shell, LCSW Clinical Social Worker Phone:  [phone] Cell:  [phone]

## 2019-02-14 ENCOUNTER — Telehealth: Payer: Self-pay | Admitting: General Practice

## 2019-02-14 ENCOUNTER — Telehealth: Payer: Self-pay | Admitting: Hematology

## 2019-02-14 ENCOUNTER — Telehealth: Payer: Self-pay

## 2019-02-14 ENCOUNTER — Encounter: Payer: Self-pay | Admitting: General Practice

## 2019-02-14 NOTE — Telephone Encounter (Signed)
[place] CSW Progress Notes  Per staff message from [place] at [facility], patient has been requested to call and schedule in person appointment and has not done so.  CSW called daughter Jeanella Anton, explained that she can call on mother's behalf and schedule appt - says she will do this tomorrow.  Will also provide any information I receive on additional services for deaf/hard of hearing in [facility] as patient does not have these services in place at this time.  Edwyna Shell, LCSW Clinical Social Worker Phone:  [phone] Cell:  [phone]

## 2019-02-14 NOTE — Progress Notes (Signed)
[place] CSW Progress Notes  Spoke w daughter Allyzon Dubbs.  Daughter clarified that hearing loss is a significant barrier to patient care.  Patient has severely limited hearing - lost hearing in both ears due to stroke. Now has minimal hearing and relies on lip reading to understand.  Needs in person visits where provider can verify she can hear and understand what is being said.  Is "tech savvy" per daughter, knows how to text and manage phone.  However, her VM is not set up  Asked daughter to work w patient to get VM set up so providers can leave message.  Will also connect patient w CSW for deaf and hard of hearing [phone]) as patient does not have any assistive communication devices.  Patient lives with her mother, not her daughter, at this time.  Daughter is available to help transport to if appointments are scheduled on days she is available.  Please work through daughter Jeanella Anton to schedule.  Edwyna Shell, LCSW Clinical Social Worker Phone:  [phone] Cell:  [phone]

## 2019-02-14 NOTE — Telephone Encounter (Signed)
Opened in error

## 2019-02-14 NOTE — Telephone Encounter (Signed)
Scheduled appt per 10/6 los.  Sent a staff message to get a calendar mailed out.

## 2019-02-15 ENCOUNTER — Encounter: Payer: Self-pay | Admitting: General Practice

## 2019-02-15 NOTE — Progress Notes (Signed)
[place] CSW Progress Notes  Spoke w [facility], they are not able to accept patient for their case management services until she establishes w an eligible PCP.  If they are unable to schedule at [facility], it was recommended that the daughter all the physician referral line - [phone] in order to be referred for a PCP w availability that accepts her insurance.  Information given to daughter.  Message sent to Manya Silvas and Dr Irene Limbo w this information.  Edwyna Shell, LCSW Clinical Social Worker Phone:  [phone]

## 2019-02-20 ENCOUNTER — Encounter: Payer: Self-pay | Admitting: General Practice

## 2019-02-20 ENCOUNTER — Other Ambulatory Visit: Payer: Self-pay

## 2019-02-20 ENCOUNTER — Ambulatory Visit: Payer: Medicare Other | Attending: Family Medicine | Admitting: Pharmacist

## 2019-02-20 DIAGNOSIS — I240 Acute coronary thrombosis not resulting in myocardial infarction: Secondary | ICD-10-CM | POA: Diagnosis present

## 2019-02-20 LAB — POCT INR: INR: 3.8 — AB (ref 2.0–3.0)

## 2019-02-20 MED ORDER — FUROSEMIDE 40 MG PO TABS: 40.0000 mg | ORAL_TABLET | Freq: Two times a day (BID) | ORAL | 0 refills | Status: DC

## 2019-02-20 MED ORDER — WARFARIN SODIUM 5 MG PO TABS: ORAL_TABLET | ORAL | 1 refills | Status: DC

## 2019-02-20 MED ORDER — HYDRALAZINE HCL 25 MG PO TABS: 25.0000 mg | ORAL_TABLET | Freq: Three times a day (TID) | ORAL | 2 refills | Status: DC

## 2019-02-20 MED ORDER — CARVEDILOL 25 MG PO TABS: 25.0000 mg | ORAL_TABLET | Freq: Two times a day (BID) | ORAL | 6 refills | Status: AC

## 2019-02-20 MED ORDER — LISINOPRIL 40 MG PO TABS: 40.0000 mg | ORAL_TABLET | Freq: Every day | ORAL | 2 refills | Status: DC

## 2019-02-20 MED FILL — LISINOPRIL 40 MG TABLET: 40 | 30 days supply | Qty: 30 | Fill #0

## 2019-02-20 MED FILL — hydrALAZINE HCL 25 MG TABS: 25 | 30 days supply | Qty: 90 | Fill #0

## 2019-02-20 MED FILL — WARFARIN SODIUM 5 MG TABLET: 5 | 30 days supply | Qty: 60 | Fill #0

## 2019-02-20 MED FILL — CARVEDILOL 25 MG TABLET: 25 | 30 days supply | Qty: 60 | Fill #0

## 2019-02-20 MED FILL — FUROSEMIDE 40 MG TAB: 40 | 30 days supply | Qty: 60 | Fill #0

## 2019-02-20 NOTE — Progress Notes (Signed)
[place] CSW Progress Notes  Spoke w Rhina Brackett [phone]) - [place] local rep for Deaf and Hard of Hearing Services.  Rep explained that their program is able to assist w either hearing aid or communication device as well as provide guidance on improving communication w patient who potentially is hard of hearing.  The first step is for patient to have a hearing evaluation to document and provide recommendations on how to address potential hearing loss.  Will connect patient w this resource and encourage her to call.  Have also communicated this information to [facility] providers - appears that patient has appt to establish for primary care w Dr Geryl Rankins in early November.  Have also spoken w CSW Bridgette Habermann to follow up on referrals made for cardiology by oncologist.  Per Tobe Sos, their lead RN will be reaching out to patient to schedule.    Edwyna Shell, LCSW Clinical Social Worker Phone:  [phone] Cell:  [phone]

## 2019-02-21 ENCOUNTER — Telehealth: Payer: Self-pay | Admitting: General Practice

## 2019-02-21 NOTE — Telephone Encounter (Signed)
[place] CSW Progress Notes  Spoke w daughter, Leda Gauze.  Provided contact information for Rhina Brackett, [place] Deaf [facility] Hard of Hearing local staff [phone]).  Encouraged to call and explore resources available for mother.  Edwyna Shell, LCSW Clinical Social Worker Phone:  [phone]

## 2019-03-06 ENCOUNTER — Ambulatory Visit: Payer: Medicare Other | Attending: Family Medicine | Admitting: Pharmacist

## 2019-03-06 ENCOUNTER — Other Ambulatory Visit: Payer: Self-pay

## 2019-03-06 DIAGNOSIS — I240 Acute coronary thrombosis not resulting in myocardial infarction: Secondary | ICD-10-CM | POA: Diagnosis present

## 2019-03-06 LAB — POCT INR: INR: 3.5 — AB (ref 2.0–3.0)

## 2019-03-07 ENCOUNTER — Other Ambulatory Visit: Payer: Self-pay | Admitting: Hematology

## 2019-03-07 DIAGNOSIS — C9210 Chronic myeloid leukemia, BCR/ABL-positive, not having achieved remission: Secondary | ICD-10-CM

## 2019-03-09 MED FILL — IMATINIB MESYLATE 100 MG TA: 100 | 30 days supply | Qty: 60 | Fill #0

## 2019-03-16 ENCOUNTER — Telehealth: Payer: Medicare Other | Admitting: Nurse Practitioner

## 2019-03-16 ENCOUNTER — Ambulatory Visit: Payer: Medicare Other | Admitting: Nurse Practitioner

## 2019-03-20 ENCOUNTER — Other Ambulatory Visit: Payer: Self-pay

## 2019-03-20 ENCOUNTER — Ambulatory Visit: Payer: Medicare Other | Attending: Family Medicine | Admitting: Pharmacist

## 2019-03-20 DIAGNOSIS — I240 Acute coronary thrombosis not resulting in myocardial infarction: Secondary | ICD-10-CM

## 2019-03-20 LAB — POCT INR: INR: 3.1 — AB (ref 2.0–3.0)

## 2019-03-21 ENCOUNTER — Other Ambulatory Visit: Payer: Self-pay

## 2019-03-21 ENCOUNTER — Ambulatory Visit (HOSPITAL_COMMUNITY)
Admission: RE | Admit: 2019-03-21 | Discharge: 2019-03-21 | Disposition: A | Discharge status: Home / Self Care | Payer: Medicare Other | Source: Ambulatory Visit | Attending: Cardiology | Admitting: Cardiology

## 2019-03-21 ENCOUNTER — Encounter (HOSPITAL_COMMUNITY): Payer: Self-pay | Admitting: Cardiology

## 2019-03-21 VITALS — BP 133/79 | HR 81 | Wt 216.6 lb

## 2019-03-21 DIAGNOSIS — Z7901 Long term (current) use of anticoagulants: Secondary | ICD-10-CM | POA: Diagnosis not present

## 2019-03-21 DIAGNOSIS — I428 Other cardiomyopathies: Secondary | ICD-10-CM | POA: Diagnosis not present

## 2019-03-21 DIAGNOSIS — Z8673 Personal history of transient ischemic attack (TIA), and cerebral infarction without residual deficits: Secondary | ICD-10-CM | POA: Insufficient documentation

## 2019-03-21 DIAGNOSIS — F17210 Nicotine dependence, cigarettes, uncomplicated: Secondary | ICD-10-CM | POA: Insufficient documentation

## 2019-03-21 DIAGNOSIS — Z79899 Other long term (current) drug therapy: Secondary | ICD-10-CM | POA: Insufficient documentation

## 2019-03-21 DIAGNOSIS — Z8249 Family history of ischemic heart disease and other diseases of the circulatory system: Secondary | ICD-10-CM | POA: Insufficient documentation

## 2019-03-21 DIAGNOSIS — I5022 Chronic systolic (congestive) heart failure: Secondary | ICD-10-CM | POA: Diagnosis present

## 2019-03-21 DIAGNOSIS — I110 Hypertensive heart disease with heart failure: Secondary | ICD-10-CM | POA: Insufficient documentation

## 2019-03-21 LAB — BASIC METABOLIC PANEL
Anion gap: 9 (ref 5–15)
BUN: 16 mg/dL (ref 6–20)
CO2: 29 mmol/L (ref 22–32)
Calcium: 8.9 mg/dL (ref 8.9–10.3)
Chloride: 103 mmol/L (ref 98–111)
Creatinine, Ser: 1.08 mg/dL — ABNORMAL HIGH (ref 0.44–1.00)
GFR calc Af Amer: >60 mL/min (ref >60)
GFR calc non Af Amer: >60 mL/min (ref >60)
Glucose, Bld: 91 mg/dL (ref 70–99)
Potassium: 4.1 mmol/L (ref 3.5–5.1)
Sodium: 141 mmol/L (ref 135–145)

## 2019-03-21 LAB — CBC
HCT: 40 % (ref 36.0–46.0)
Hemoglobin: 12.1 g/dL (ref 12.0–15.0)
MCH: 24.5 pg — ABNORMAL LOW (ref 26.0–34.0)
MCHC: 30.3 g/dL (ref 30.0–36.0)
MCV: 81 fL (ref 80.0–100.0)
Platelets: 283 10*3/uL (ref 150–400)
RBC: 4.94 MIL/uL (ref 3.87–5.11)
RDW: 18.4 % — ABNORMAL HIGH (ref 11.5–15.5)
WBC: 8.1 10*3/uL (ref 4.0–10.5)
nRBC: 0 % (ref 0.0–0.2)

## 2019-03-21 MED ORDER — ENTRESTO 24-26 MG PO TABS: 1.0000 | ORAL_TABLET | Freq: Two times a day (BID) | ORAL | 3 refills | Status: DC

## 2019-03-21 MED ORDER — SPIRONOLACTONE 25 MG PO TABS: 12.5000 mg | ORAL_TABLET | Freq: Every day | ORAL | 3 refills | Status: DC

## 2019-03-21 MED ORDER — ISOSORBIDE MONONITRATE ER 30 MG PO TB24: 30.0000 mg | ORAL_TABLET | Freq: Every day | ORAL | 6 refills | Status: AC

## 2019-03-21 NOTE — Patient Instructions (Signed)
STOP Lisinopril  Start Entresto 24/26 mg Twice daily STARTING ON Friday 11/13 AM  Start Isosorbide 30 mg daily  Start Spironolactone 12.5 mg (1/2 tab) daily  Labs done today, we will call you for abnormal results  Labs in 10 days  Please follow up with our heart failure pharmacist in 2-4 weeks  Your physician recommends that you schedule a follow-up appointment in: 6 weeks  If you have any questions or concerns before your next appointment please send [place] a message through [place] or call our office at [phone].  At the [facility], you and your health needs are our priority. As part of our continuing mission to provide you with exceptional heart care, we have created designated Provider Care Teams. These Care Teams include your primary Cardiologist (physician) and Advanced Practice Providers (APPs- Physician Assistants and Nurse Practitioners) who all work together to provide you with the care you need, when you need it.   You may see any of the following providers on your designated Care Team at your next follow up: Marland Kitchen Dr Glori Bickers . Dr Loralie Champagne . Darrick Grinder, NP . Lyda Jester, PA   Please be sure to bring in all your medications bottles to every appointment.

## 2019-03-22 NOTE — Progress Notes (Signed)
PCP: Dr. Joya Gaskins Oncology: Dr. Irene Limbo Cardiology: Dr. Aundra Dubin  48 y.o. 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 Irene Limbo for treatment of CHF.  Patient has a long history of cardiomyopathy.  She was initially treated in [place].  Cath in 9/09 showed no significant coronary disease.  She was found to have LV thrombus and had a CVA in 2013, now on warfarin.  Last echo in 3/20 showed EF 20-25% with moderate LV dilation and moderately decreased RV systolic function.  She now lives in [place].  She was last hospitalized for CHF in 4/20.   She used to be a heavy drinker but has not drunk for years now.  She still smokes a few cigarettes/day.   She has been clinically stable recently.  No significant exertional dyspnea.  No orthopnea/PND.  No lightheadedness.  Her speech is somewhat slowed and her daughter provides much of her history, I wonder if this is a residual from her CVA.  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   ECG (personally reviewed): NSR, left axis deviation, lateral TWIs  Labs (10/20): hgb 12.5, K 4.2, creatinine 1.02  PMH: 1. Ectopic pregnancy in 2003 2. Active smoker: Few cigarettes/day.  3. Chronic systolic CHF: Nonischemic cardiomyopathy.  Long-standing.  - LHC (01/2008): No significant CAD.  - Echo (3/20): EF 20-25%, moderate LV dilation, moderately decreased RV systolic function.  4. LV thrombus.  5. H/o CVA 2013 6. CML: Imatinib.  7. Prior heavy ETOH.  8. HTN  SH: From [place] but now lives in [place] with her sister.  Daughter lives here as well.  Prior heavy ETOH.  Smokes a few cigarettes/day.   Family History  Problem Relation Age of Onset  . Hypertension Mother   . Diabetes Father   . Hypertension Father   . Heart disease Father    ROS: All systems reviewed and negative except as per HPI.   Current Outpatient Medications  Medication Sig Dispense Refill  .  acetaminophen (TYLENOL) 500 MG tablet Take 1,000 mg by mouth every 6 (six) hours as needed for mild pain.    . carvedilol (COREG) 25 MG tablet Take 1 tablet (25 mg total) by mouth 2 (two) times daily with a meal. 60 tablet 6  . furosemide (LASIX) 40 MG tablet Take 1 tablet (40 mg total) by mouth 2 (two) times daily. Must have office visit for refills 60 tablet 0  . hydrALAZINE (APRESOLINE) 25 MG tablet Take 1 tablet (25 mg total) by mouth 3 (three) times daily. 90 tablet 2  . imatinib (GLEEVEC) 100 MG tablet TAKE 2 TABLETS (200 MG TOTAL) BY MOUTH DAILY. TAKE WITH MEALS AND LARGE GLASS OF WATER. 60 tablet 0  . lisinopril (ZESTRIL) 40 MG tablet Take 1 tablet (40 mg total) by mouth daily. 30 tablet 2  . warfarin (COUMADIN) 5 MG tablet Take as directed by the Coumadin Clinic. 60 tablet 1  . isosorbide mononitrate (IMDUR) 30 MG 24 hr tablet Take 1 tablet (30 mg total) by mouth daily. 30 tablet 6  . sacubitril-valsartan (ENTRESTO) 24-26 MG Take 1 tablet by mouth 2 (two) times daily. 60 tablet 3  . spironolactone (ALDACTONE) 25 MG tablet Take 0.5 tablets (12.5 mg total) by mouth daily. 15 tablet 3   No current facility-administered medications for this encounter.    BP 133/79   Pulse 81   Wt 98.2 kg (216 lb 9.6 oz)   SpO2  95%   BMI 39.62 kg/m  General: NAD Neck: No JVD, no thyromegaly or thyroid nodule.  Lungs: Clear to auscultation bilaterally with normal respiratory effort. CV: Nondisplaced PMI.  Heart regular S1/S2, no S3/S4, no murmur.  No peripheral edema.  No carotid bruit.  Normal pedal pulses.  Abdomen: Soft, nontender, no hepatosplenomegaly, no distention.  Skin: Intact without lesions or rashes.  Neurologic: Alert and oriented x 3.  Psych: Normal affect. Extremities: No clubbing or cyanosis.  HEENT: Normal.   Assessment/Plan: 1. Chronic systolic CHF: Nonischemic cardiomyopathy.  ?Related to HTN or prior ETOH abuse.  Last echo in 3/20 with EF 20-25% with moderate RV dysfunction.   Long-standing cardiomyopathy (found in 2009).  NYHA class II symptoms.  She is not volume overloaded on exam.   - Continue Coreg 25 mg bid.  - Continue Lasix 40 mg bid, she is euvolemic on exam.  - Continue hydralazine 25 mg tid and will add Imdur 30 mg daily.  - Stop lisinopril, after 36 hrs will start Entresto 24/26 bid.  BMET today and again in 10 days.  - Start spironolactone 12.5 mg daily.  - With long-standing cardiomyopathy and young age, think she would be reasonable ICD candidate as long as her life expectancy as regards CML is reasonable.  I will check with Dr. Irene Limbo.  She is not a CRT candidate with narrow QRS.  - Would arrange for cardiac MRI to assess for infiltrative disease.  2. LV thrombus: With history of CVA.   - Continue warfarin.  3. CML: She has been on imatinib.  This can cause volume retention/edema, but she has tolerated well so far.  She could increase if needed.  4. Smoking: I strongly encouraged her to quit.   Loralie Champagne 03/22/2019

## 2019-03-27 ENCOUNTER — Ambulatory Visit: Payer: Medicare Other | Admitting: Pharmacist

## 2019-04-02 ENCOUNTER — Ambulatory Visit (HOSPITAL_COMMUNITY)
Admission: RE | Admit: 2019-04-02 | Discharge: 2019-04-02 | Disposition: A | Discharge status: Home / Self Care | Payer: Medicare Other | Source: Ambulatory Visit | Attending: Cardiology | Admitting: Cardiology

## 2019-04-02 ENCOUNTER — Other Ambulatory Visit: Payer: Self-pay | Admitting: Hematology

## 2019-04-02 ENCOUNTER — Other Ambulatory Visit: Payer: Self-pay

## 2019-04-02 DIAGNOSIS — I5022 Chronic systolic (congestive) heart failure: Secondary | ICD-10-CM | POA: Diagnosis present

## 2019-04-02 DIAGNOSIS — C9210 Chronic myeloid leukemia, BCR/ABL-positive, not having achieved remission: Secondary | ICD-10-CM

## 2019-04-02 LAB — BASIC METABOLIC PANEL
Anion gap: 3 — ABNORMAL LOW (ref 5–15)
BUN: 10 mg/dL (ref 6–20)
CO2: 31 mmol/L (ref 22–32)
Calcium: 8.8 mg/dL — ABNORMAL LOW (ref 8.9–10.3)
Chloride: 107 mmol/L (ref 98–111)
Creatinine, Ser: 0.97 mg/dL (ref 0.44–1.00)
GFR calc Af Amer: >60 mL/min (ref >60)
GFR calc non Af Amer: >60 mL/min (ref >60)
Glucose, Bld: 94 mg/dL (ref 70–99)
Potassium: 4.3 mmol/L (ref 3.5–5.1)
Sodium: 141 mmol/L (ref 135–145)

## 2019-04-03 ENCOUNTER — Telehealth (HOSPITAL_COMMUNITY): Payer: Self-pay | Admitting: Pharmacist

## 2019-04-03 NOTE — Progress Notes (Signed)
PCP: Dr. Joya Gaskins Oncology: Dr. Irene Limbo Cardiology: Dr. Aundra Dubin  HPI:  48 y.o. 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 Irene Limbo for treatment of CHF.  Patient has a long history of cardiomyopathy.  She was initially treated in [place].  Cath in 9/09 showed no significant coronary disease.  She was found to have LV thrombus and had a CVA in 2013, now on warfarin.  Last echo in 3/20 showed EF 20-25% with moderate LV dilation and moderately decreased RV systolic function.  She now lives in [place].  She was last hospitalized for CHF in 4/20.   She used to be a heavy drinker but has not drunk for years now.  She still smokes a few cigarettes/day.   Recently presented to HF Clinic with Dr. Aundra Dubin on 03/21/2019. She has been clinically stable recently.  No significant exertional dyspnea.  No orthopnea/PND.  No lightheadedness.  Her speech is somewhat slowed and her daughter provided much of her history, which is likely residual from her CVA.  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   Today she returns to HF clinic for pharmacist medication titration. At last visit with MD, lisinopril was changed to Entresto 24/26 mg BID and spironolactone 12.5 mg daily and imdur 30 mg daily were initiated. Overall she is doing well with those changes, although she does complain of some headaches with the imdur. No dizziness, lightheadedness, chest pain or palpitations.  Her breathing is good. No SOB/DOE. She does weigh herself daily and her weight has been increasing. However, she believes this is because she moved in with her mom and is eating more than her usual - she does not think it is fluid weight. She takes furosemide 40 mg BID and uses an extra PRN dose a few times a month. Last furosemide PRN dose was 3 weeks ago. No PND or orthopnea. Trace edema in bilateral lower extremities. Her appetite is "too good". She tries to limit  salt intake but this is more difficult now that she lives with her mom.    . Shortness of breath/dyspnea on exertion? no  . Orthopnea/PND? no . Edema? Yes - trace . Lightheadedness/dizziness? no . Daily weights at home? yes . Blood pressure/heart rate monitoring at home? no . Following low-sodium/fluid-restricted diet? yes  HF Medications: Carvedilol 25 mg BID Entresto 24/26 mg BID Spironolactone 12.5 mg daily Hydralazine 25 mg TID Imdur 30 mg daily Furosemide 40 mg BID  Has the patient been experiencing any side effects to the medications prescribed?  Yes - headaches with imdur  Does the patient have any problems obtaining medications due to transportation or finances?   No - has dual Medicare/Medicaid coverage  Understanding of regimen: fair Understanding of indications: fair Potential of compliance: good Patient understands to avoid NSAIDs. Patient understands to avoid decongestants.    Pertinent Lab Values (04/02/19): Serum creatinine 0.97 , BUN 10, Potassium 4.3, Sodium 141  Vital Signs: . Weight: 221 lbs (last clinic weight: 216.6 lbs) . Blood pressure: 160/100  . Heart rate: 86   Assessment: 1. Chronic systolic CHF: Nonischemic cardiomyopathy.  ?Related to HTN or prior ETOH abuse.  Last echo in 3/20 with EF 20-25% with moderate RV dysfunction.  Long-standing cardiomyopathy (found in 2009).   - NYHA class II symptoms.  She is not volume overloaded on exam.   - Vitals: BP elevated at 160/104, HR 86.  - Labs were stable on 04/02/19:  Scr 0.97, K 4.3 - Continue Lasix 40 mg BID. - Continue Coreg 25 mg BID.  - Increase Entresto to 49/51 mg BID. Repeat BMET in 1 week at oncology appt (04/17/19) - Continue spironolactone 12.5 mg daily. Plan to increase next visit.  - Continue hydralazine 25 mg tid and Imdur 30 mg daily.   - With long-standing cardiomyopathy and young age, think she would be reasonable ICD candidate as long as her life expectancy as regards CML is  reasonable. She is not a CRT candidate with narrow QRS.  - Would arrange for cardiac MRI to assess for infiltrative disease.  2. LV thrombus: With history of CVA.   - Continue warfarin.  3. CML: She has been on imatinib.  This can cause volume retention/edema, but she has tolerated well so far.   4. Smoking: I strongly encouraged her to quit.    Plan: 1) Medication changes: Based on clinical presentation, vital signs and recent labs will increase Entresto to 49/51 mg BID. Repeat BMET In 1 week at oncology appointment.  2) Labs: Scr 0.97, K 4.3 3) Follow-up: 4 weeks with Dr. Rush Farmer, PharmD, BCPS, [facility] [place] [facility] Pharmacist [phone]

## 2019-04-03 NOTE — Telephone Encounter (Addendum)
Advanced Heart Failure Patient Advocate Encounter  Prior Authorization for Delene Loll has been approved.    Audry Riles, PharmD, BCPS, BCCP, CPP Heart Failure Clinic Pharmacist [phone]

## 2019-04-04 MED FILL — IMATINIB MESYLATE 100 MG TA: 100 | 30 days supply | Qty: 60 | Fill #0

## 2019-04-10 ENCOUNTER — Other Ambulatory Visit: Payer: Self-pay

## 2019-04-10 ENCOUNTER — Ambulatory Visit (HOSPITAL_COMMUNITY)
Admission: RE | Admit: 2019-04-10 | Discharge: 2019-04-10 | Disposition: A | Discharge status: Home / Self Care | Payer: Medicare Other | Source: Ambulatory Visit | Attending: Cardiology | Admitting: Cardiology

## 2019-04-10 VITALS — BP 160/100 | HR 86 | Wt 221.0 lb

## 2019-04-10 DIAGNOSIS — I5022 Chronic systolic (congestive) heart failure: Secondary | ICD-10-CM | POA: Diagnosis present

## 2019-04-10 DIAGNOSIS — C9210 Chronic myeloid leukemia, BCR/ABL-positive, not having achieved remission: Secondary | ICD-10-CM | POA: Insufficient documentation

## 2019-04-10 DIAGNOSIS — F17200 Nicotine dependence, unspecified, uncomplicated: Secondary | ICD-10-CM | POA: Diagnosis not present

## 2019-04-10 DIAGNOSIS — I428 Other cardiomyopathies: Secondary | ICD-10-CM | POA: Diagnosis not present

## 2019-04-10 DIAGNOSIS — Z8673 Personal history of transient ischemic attack (TIA), and cerebral infarction without residual deficits: Secondary | ICD-10-CM | POA: Insufficient documentation

## 2019-04-10 MED ORDER — SACUBITRIL-VALSARTAN 49-51 MG PO TABS: 1.0000 | ORAL_TABLET | Freq: Two times a day (BID) | ORAL | 11 refills | Status: DC

## 2019-04-10 NOTE — Patient Instructions (Signed)
It was a pleasure seeing you today!  MEDICATIONS: -We are changing your medications today -Increase Entresto to 49/51 mg (1 tab) twice daily. You can take two tablets of the 24/26 mg strength twice daily until you pick up the new prescription.  -Call if you have questions about your medications.  NEXT APPOINTMENT: Return to clinic in 4 weeks with Dr. Aundra Dubin.  In general, to take care of your heart failure: -Limit your fluid intake to 2 Liters (half-gallon) per day.   -Limit your salt intake to ideally 2-3 grams (2000-3000 mg) per day. -Weigh yourself daily and record, and bring that "weight diary" to your next appointment.  (Weight gain of 2-3 pounds in 1 day typically means fluid weight.) -The medications for your heart are to help your heart and help you live longer.   -Please contact us before stopping any of your heart medications.  Call the clinic at [phone] with questions or to reschedule future appointments.

## 2019-04-11 MED FILL — CARVEDILOL 25 MG TABLET: 25 | 30 days supply | Qty: 60 | Fill #1

## 2019-04-11 MED FILL — WARFARIN SODIUM 5 MG TABLET: 5 | 30 days supply | Qty: 60 | Fill #1

## 2019-04-11 MED FILL — hydrALAZINE HCL 25 MG TABS: 25 | 30 days supply | Qty: 90 | Fill #1

## 2019-04-17 ENCOUNTER — Inpatient Hospital Stay: Payer: Medicare Other | Admitting: Hematology

## 2019-04-17 ENCOUNTER — Inpatient Hospital Stay: Payer: Medicare Other

## 2019-04-18 ENCOUNTER — Telehealth: Payer: Self-pay | Admitting: Hematology

## 2019-04-18 NOTE — Telephone Encounter (Signed)
R/s appt per 12/8 sch message - pt daughter is aware of appt date and time

## 2019-05-07 ENCOUNTER — Other Ambulatory Visit: Payer: Self-pay

## 2019-05-07 ENCOUNTER — Encounter (HOSPITAL_COMMUNITY): Payer: Self-pay | Admitting: Cardiology

## 2019-05-07 ENCOUNTER — Ambulatory Visit (HOSPITAL_COMMUNITY)
Admission: RE | Admit: 2019-05-07 | Discharge: 2019-05-07 | Disposition: A | Discharge status: Home / Self Care | Payer: Medicare Other | Source: Ambulatory Visit | Attending: Cardiology | Admitting: Cardiology

## 2019-05-07 VITALS — BP 143/76 | HR 76 | Wt 222.4 lb

## 2019-05-07 DIAGNOSIS — Z79899 Other long term (current) drug therapy: Secondary | ICD-10-CM | POA: Insufficient documentation

## 2019-05-07 DIAGNOSIS — C9210 Chronic myeloid leukemia, BCR/ABL-positive, not having achieved remission: Secondary | ICD-10-CM | POA: Insufficient documentation

## 2019-05-07 DIAGNOSIS — Z8673 Personal history of transient ischemic attack (TIA), and cerebral infarction without residual deficits: Secondary | ICD-10-CM | POA: Insufficient documentation

## 2019-05-07 DIAGNOSIS — R220 Localized swelling, mass and lump, head: Secondary | ICD-10-CM | POA: Insufficient documentation

## 2019-05-07 DIAGNOSIS — F17210 Nicotine dependence, cigarettes, uncomplicated: Secondary | ICD-10-CM | POA: Diagnosis not present

## 2019-05-07 DIAGNOSIS — I428 Other cardiomyopathies: Secondary | ICD-10-CM | POA: Insufficient documentation

## 2019-05-07 DIAGNOSIS — Z8249 Family history of ischemic heart disease and other diseases of the circulatory system: Secondary | ICD-10-CM | POA: Diagnosis not present

## 2019-05-07 DIAGNOSIS — I110 Hypertensive heart disease with heart failure: Secondary | ICD-10-CM | POA: Diagnosis not present

## 2019-05-07 DIAGNOSIS — Z7901 Long term (current) use of anticoagulants: Secondary | ICD-10-CM | POA: Diagnosis not present

## 2019-05-07 DIAGNOSIS — I5022 Chronic systolic (congestive) heart failure: Secondary | ICD-10-CM | POA: Insufficient documentation

## 2019-05-07 DIAGNOSIS — Z833 Family history of diabetes mellitus: Secondary | ICD-10-CM | POA: Diagnosis not present

## 2019-05-07 LAB — BASIC METABOLIC PANEL
Anion gap: 7 (ref 5–15)
BUN: 5 mg/dL — ABNORMAL LOW (ref 6–20)
CO2: 30 mmol/L (ref 22–32)
Calcium: 8.6 mg/dL — ABNORMAL LOW (ref 8.9–10.3)
Chloride: 107 mmol/L (ref 98–111)
Creatinine, Ser: 0.88 mg/dL (ref 0.44–1.00)
GFR calc Af Amer: >60 mL/min (ref >60)
GFR calc non Af Amer: >60 mL/min (ref >60)
Glucose, Bld: 99 mg/dL (ref 70–99)
Potassium: 4.4 mmol/L (ref 3.5–5.1)
Sodium: 144 mmol/L (ref 135–145)

## 2019-05-07 MED ORDER — SPIRONOLACTONE 25 MG PO TABS: 25.0000 mg | ORAL_TABLET | Freq: Every day | ORAL | 5 refills | Status: DC

## 2019-05-07 MED ORDER — FUROSEMIDE 40 MG PO TABS: 40.0000 mg | ORAL_TABLET | Freq: Two times a day (BID) | ORAL | 6 refills | Status: DC

## 2019-05-07 NOTE — Patient Instructions (Addendum)
INCREASE Spironolactone to 25mg  (1 tab) daily   RESTART Lasix 40mg  ( 1 tab) twice a day   Labs today We will only contact you if something comes back abnormal or we need to make some changes. Otherwise no news is good news!   You have been referred to the Paramedicine program.  A paramedic will make weekly visits with you to ensure that you are feeling well and help you to organize your medication box.  You will receive a call to schedule the first visit.   You have been referred to the electrophysiologist to discuss placement of an ICD.  Your physician has recommended that you have a defibrillator inserted. An implantable cardioverter defibrillator (ICD) is a small device that is placed in your chest or, in rare cases, your abdomen. This device uses electrical pulses or shocks to help control life-threatening, irregular heartbeats that could lead the heart to suddenly stop beating (sudden cardiac arrest). Leads are attached to the ICD that goes into your heart. This is done in the hospital and usually requires an overnight stay. Please see the instruction sheet given to you today for more information.    Your physician recommends that you schedule a follow-up appointment in: 2 weeks with the Nurse Practitioner/Physicians Assistant.   At the [facility], you and your health needs are our priority. As part of our continuing mission to provide you with exceptional heart care, we have created designated Provider Care Teams. These Care Teams include your primary Cardiologist (physician) and Advanced Practice Providers (APPs- Physician Assistants and Nurse Practitioners) who all work together to provide you with the care you need, when you need it.   You may see any of the following providers on your designated Care Team at your next follow up: Marland Kitchen Dr Glori Bickers . Dr Loralie Champagne . Darrick Grinder, NP . Lyda Jester, PA . Audry Riles, PharmD   Please be sure to bring in  all your medications bottles to every appointment.

## 2019-05-07 NOTE — Progress Notes (Signed)
PCP: Dr. Joya Gaskins Oncology: Dr. Irene Limbo Cardiology: Dr. Aundra Dubin  48 y.o. 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 Irene Limbo for treatment of CHF.  Patient has a long history of cardiomyopathy.  She was initially treated in [place].  Cath in 9/09 showed no significant coronary disease.  She was found to have LV thrombus and had a CVA in 2013, now on warfarin.  Last echo in 3/20 showed EF 20-25% with moderate LV dilation and moderately decreased RV systolic function.  She now lives in [place].  She was last hospitalized for CHF in 4/20.   She used to be a heavy drinker but has not drunk for years now.  She still smokes a few cigarettes/day.   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   Patient returns today for followup of CHF.  She is not very participatory in the history, daughter is able to fill in some gaps. It appears that she she has not been taking Lasix for a number of weeks.  I think that she is taking her other meds though this is not entirely clear, and I am not sure that she knows which medications she is actually taking. She denies exertional dyspnea or orthopnea/PND.  No chest pain. She has had swelling of her eyelids for "several months."  No swelling of lips or tongue, no stridor.  Weight is up 6 lbs.   Labs (10/20): hgb 12.5, K 4.2, creatinine 1.02 Labs (11/20): K 4.3, creatinine 0.97  PMH: 1. Ectopic pregnancy in 2003 2. Active smoker: Few cigarettes/day.  3. Chronic systolic CHF: Nonischemic cardiomyopathy.  Long-standing.  - LHC (01/2008): No significant CAD.  - Echo (3/20): EF 20-25%, moderate LV dilation, moderately decreased RV systolic function.  4. LV thrombus.  5. H/o CVA 2013 6. CML: Imatinib.  7. Prior heavy ETOH.  8. HTN  SH: From [place] but now lives in [place] with her sister.  Daughter lives here as well.  Prior heavy ETOH.  Smokes a few cigarettes/day.   Family  History  Problem Relation Age of Onset  . Hypertension Mother   . Diabetes Father   . Hypertension Father   . Heart disease Father    ROS: All systems reviewed and negative except as per HPI.   Current Outpatient Medications  Medication Sig Dispense Refill  . acetaminophen (TYLENOL) 500 MG tablet Take 1,000 mg by mouth every 6 (six) hours as needed for mild pain.    . carvedilol (COREG) 25 MG tablet Take 1 tablet (25 mg total) by mouth 2 (two) times daily with a meal. 60 tablet 6  . furosemide (LASIX) 40 MG tablet Take 1 tablet (40 mg total) by mouth 2 (two) times daily. Must have office visit for refills 60 tablet 6  . hydrALAZINE (APRESOLINE) 25 MG tablet Take 1 tablet (25 mg total) by mouth 3 (three) times daily. 90 tablet 2  . imatinib (GLEEVEC) 100 MG tablet TAKE 2 TABLETS (200 MG TOTAL) BY MOUTH DAILY. TAKE WITH MEALS AND LARGE GLASS OF WATER. 60 tablet 0  . isosorbide mononitrate (IMDUR) 30 MG 24 hr tablet Take 1 tablet (30 mg total) by mouth daily. 30 tablet 6  . sacubitril-valsartan (ENTRESTO) 49-51 MG Take 1 tablet by mouth 2 (two) times daily. 60 tablet 11  . spironolactone (ALDACTONE) 25 MG tablet Take 1 tablet (25 mg total) by mouth daily. 30 tablet 5  . warfarin (COUMADIN) 5 MG  tablet Take as directed by the Coumadin Clinic. 60 tablet 1   No current facility-administered medications for this encounter.   BP (!) 143/76   Pulse 76   Wt 100.9 kg (222 lb 6.4 oz)   SpO2 96%   BMI 40.68 kg/m  General: NAD Neck: JVP 8-9 cm, no thyromegaly or thyroid nodule.  Lungs: Clear to auscultation bilaterally with normal respiratory effort. CV: Nondisplaced PMI.  Heart regular S1/S2, no S3/S4, no murmur.  1+ edema to knees.  No carotid bruit.  Normal pedal pulses.  Abdomen: Soft, nontender, no hepatosplenomegaly, no distention.  Skin: Intact without lesions or rashes.  Neurologic: Alert and oriented x 3.  Psych: Normal affect. Extremities: No clubbing or cyanosis.  HEENT: Swelling  of upper eyelids, not severe.   Assessment/Plan: 1. Chronic systolic CHF: Nonischemic cardiomyopathy.  ?Related to HTN or prior ETOH abuse.  Last echo in 3/20 with EF 20-25% with moderate RV dysfunction.  Long-standing cardiomyopathy (found in 2009).  NYHA class II symptoms.  She is volume overloaded on exam and weight is up.  Think she is taking most of her meds but not clear, definitely has not been taking Lasix for weeks (ran out).  - Continue Coreg 25 mg bid.  - Restart Lasix 40 mg bid. BMET today.  - Continue hydralazine 25 mg tid and Imdur 30 mg daily.  - Continue Entresto 24/26 bid.   - Increase spironolactone to 25 mg daily, BMET 10 days.  - With long-standing cardiomyopathy and young age, think she would be reasonable ICD candidate as CML is stable with hematological response.  Refer to EP for ICD. Narrow QRS so not CRT candidate.   - Needs cardiac MRI to assess for infiltrative disease.  2. LV thrombus: With history of CVA.   - Continue warfarin.  3. CML: She has been on imatinib.  This can cause volume retention/edema, but she has tolerated well so far.  She could increase if needed.  4. Smoking: I strongly encouraged her to quit.  5. Upper eyelid swelling: Not associated with lip/tongue swelling, doubt angioedema.  ?Related to volume overload off Lasix for several weeks. - Restart Lasix and reassess.   I will refer to paramedicine given concerns about medication compliance.  Followup with NP/PA in 2 wks.   Loralie Champagne 05/07/2019

## 2019-05-08 ENCOUNTER — Telehealth (HOSPITAL_COMMUNITY): Payer: Self-pay

## 2019-05-08 NOTE — Telephone Encounter (Signed)
Called pt regarding new referral to paramedicine program. No answer and unable to LVM.   Marylouise Stacks, EMT-Paramedic  05/08/19

## 2019-05-16 ENCOUNTER — Telehealth (HOSPITAL_COMMUNITY): Payer: Self-pay

## 2019-05-16 ENCOUNTER — Other Ambulatory Visit: Payer: Self-pay

## 2019-05-16 ENCOUNTER — Inpatient Hospital Stay (HOSPITAL_BASED_OUTPATIENT_CLINIC_OR_DEPARTMENT_OTHER): Payer: Medicare Other | Admitting: Hematology

## 2019-05-16 ENCOUNTER — Telehealth: Payer: Self-pay | Admitting: Hematology

## 2019-05-16 ENCOUNTER — Other Ambulatory Visit: Payer: Self-pay | Admitting: Family Medicine

## 2019-05-16 ENCOUNTER — Inpatient Hospital Stay: Payer: Medicare Other | Attending: Hematology

## 2019-05-16 VITALS — BP 150/82 | HR 97 | Temp 97.8°F | Resp 18 | Ht 62.0 in | Wt 213.0 lb

## 2019-05-16 DIAGNOSIS — C9210 Chronic myeloid leukemia, BCR/ABL-positive, not having achieved remission: Secondary | ICD-10-CM | POA: Diagnosis present

## 2019-05-16 DIAGNOSIS — Z79899 Other long term (current) drug therapy: Secondary | ICD-10-CM | POA: Insufficient documentation

## 2019-05-16 DIAGNOSIS — Z7901 Long term (current) use of anticoagulants: Secondary | ICD-10-CM | POA: Insufficient documentation

## 2019-05-16 LAB — CMP (CANCER CENTER ONLY)
ALT: 11 U/L (ref 0–44)
AST: 15 U/L (ref 15–41)
Albumin: 3.7 g/dL (ref 3.5–5.0)
Alkaline Phosphatase: 61 U/L (ref 38–126)
Anion gap: 8 (ref 5–15)
BUN: 12 mg/dL (ref 6–20)
CO2: 33 mmol/L — ABNORMAL HIGH (ref 22–32)
Calcium: 8.9 mg/dL (ref 8.9–10.3)
Chloride: 104 mmol/L (ref 98–111)
Creatinine: 0.98 mg/dL (ref 0.44–1.00)
GFR, Est AFR Am: >60 mL/min (ref >60)
GFR, Estimated: >60 mL/min (ref >60)
Glucose, Bld: 114 mg/dL — ABNORMAL HIGH (ref 70–99)
Potassium: 3.8 mmol/L (ref 3.5–5.1)
Sodium: 145 mmol/L (ref 135–145)
Total Bilirubin: 0.3 mg/dL (ref 0.3–1.2)
Total Protein: 7.4 g/dL (ref 6.5–8.1)

## 2019-05-16 LAB — CBC WITH DIFFERENTIAL/PLATELET
Abs Immature Granulocytes: 0.01 10*3/uL (ref 0.00–0.07)
Basophils Absolute: 0 10*3/uL (ref 0.0–0.1)
Basophils Relative: 0 %
Eosinophils Absolute: 0.2 10*3/uL (ref 0.0–0.5)
Eosinophils Relative: 3 %
HCT: 38.9 % (ref 36.0–46.0)
Hemoglobin: 12 g/dL (ref 12.0–15.0)
Immature Granulocytes: 0 %
Lymphocytes Relative: 31 %
Lymphs Abs: 2.2 10*3/uL (ref 0.7–4.0)
MCH: 25.5 pg — ABNORMAL LOW (ref 26.0–34.0)
MCHC: 30.8 g/dL (ref 30.0–36.0)
MCV: 82.6 fL (ref 80.0–100.0)
Monocytes Absolute: 0.5 10*3/uL (ref 0.1–1.0)
Monocytes Relative: 7 %
Neutro Abs: 4.1 10*3/uL (ref 1.7–7.7)
Neutrophils Relative %: 59 %
Platelets: 253 10*3/uL (ref 150–400)
RBC: 4.71 MIL/uL (ref 3.87–5.11)
RDW: 17.7 % — ABNORMAL HIGH (ref 11.5–15.5)
WBC: 7.1 10*3/uL (ref 4.0–10.5)
nRBC: 0 % (ref 0.0–0.2)

## 2019-05-16 MED ORDER — IMATINIB MESYLATE 100 MG PO TABS: 200.0000 mg | ORAL_TABLET | Freq: Every day | ORAL | 3 refills | Status: DC

## 2019-05-16 MED FILL — WARFARIN SODIUM 5 MG TABLET: 5 | 30 days supply | Qty: 60 | Fill #0

## 2019-05-16 MED FILL — CARVEDILOL 25 MG TABLET: 25 | 30 days supply | Qty: 60 | Fill #2

## 2019-05-16 MED FILL — hydrALAZINE HCL 25 MG TABS: 25 | 30 days supply | Qty: 90 | Fill #2

## 2019-05-16 NOTE — Telephone Encounter (Signed)
Scheduled apt per 1/6 los - gave pt avs and calender per los.

## 2019-05-16 NOTE — Telephone Encounter (Signed)
Called pt again regarding referral.   First time she answered, she hung up and then she called back-she was at the doctors office. She reports she will call back in a couple hours when she gets back home.   Marylouise Stacks, EMT-Paramedic  05/16/19

## 2019-05-16 NOTE — Progress Notes (Signed)
HEMATOLOGY/ONCOLOGY CLINIC NOTE  Date of Service: 05/16/2019  Patient Care Team: System, Pcp Not In as PCP - General   Dr. Asencion Noble as PCP  CHIEF COMPLAINTS/PURPOSE OF CONSULTATION:  Continue mx of CML  HISTORY OF PRESENTING ILLNESS:   Charlotte Wyatt is a wonderful 49 y.o. female who has been referred to [place] by Dr. Lonia Skinner for evaluation and management of her Leukocytosis. She is accompanied today by her daughter via cell phone. The pt reports that she is doing well overall.  Prior to today's visit, the pt was admitted in both early March and early April for her CHF. At both times, she was seen to have leukocytosis and a BCR-ABL was collected which was positive. She was then referred to see me.  The pt reports that she has "been good," in the last 6 months except when she has had fluid overload issues. The pt notes that her cardiac concerns are thought to be from "smoking and drinking." The pt's daughter notes that the pt previously had a concern for excessive alcohol use. The pt had a stroke in the past which her daughter notes affected the patient's memory initially. She has thus far been making medical decisions for herself.  The pt is currently living in [place], [place] but is trying to move to [place] where her daughter lives. She has been seeing a PCP, Neurology, and Cardiology in [place], and is not sure which clinic or doctors she has seen specifically. She has not established care with these specialties in [place] yet.  The pt notes that she has had weight loss recently, but is unsure how much. The pt denies abdominal pains.   Most recent lab results (08/17/18) of CBC w/diff and BMP is as follows: all values are WNL except for WBC at 43.2k, HGB at 10.5, HCT at 35.8, MCV at 74.9, MCH at 22.0, MCHC at 29.3, RDW at 23.2, nRBC at 0.8%, ANC at 29.8k, Lymphs abs at 4.3k, Monocytes abs at 1.7k, Eosinophils abs at 900, Basophils abs at 2.6k, Abs immature granulocytes at  3.9k, Glucose at 102, Creatinine at 1.10, Calcium at 8.3, GFR at 59.  On review of systems, pt reports good energy levels, and denies abdominal pains, and any other symptoms.   On PMHx the pt reports stroke, CHF. On Social Hx the pt reports previous cigarette smoking and excessive alcohol consumption  Interval History:   Charlotte Wyatt returns today for management and evaluation of her recently diagnosed CML. The patient's last visit with [place] was on 02/13/2019. The pt reports that she is doing well overall.  The pt reports that she has had occasional leg swelling but nothing persistent or bothersome. Her breathing has also been steady. Pt began care with a Cardiologist, Dr. Loralie Champagne and was placed on Lasix. She has noticed reduced puffiness in her eyes and weight loss since beginning Lasix. Pt has been taking her Lasix as prescribed and was taking her Imatinib as prescribed until she ran out on Sunday. She has been having some difficulty getting her prescription filled.   Lab results today (05/16/19) of CBC w/diff and CMP is as follows: all values are WNL except for MCH at 25.5, RDW at 17.7, CO2 at 33, Glucose at 114.  On review of systems, pt reports leg selling, reduced eye puffiness, weight loss and denies SOB, abdominal pain and any other symptoms.   MEDICAL HISTORY:  Past Medical History:  Diagnosis Date  . CAD (coronary artery disease)   .  Cardiomyopathy, secondary ([place])   . Central hearing loss   . CHF (congestive heart failure) ([place])   . Essential hypertension   . History of stroke   . Menopausal symptoms   . Mixed hyperlipidemia   . Stroke [facility])     SURGICAL HISTORY: Past Surgical History:  Procedure Laterality Date  . ATRIAL CARDIAC PACEMAKER INSERTION    . TUBAL LIGATION      SOCIAL HISTORY: Social History   Socioeconomic History  . Marital status: Single    Spouse name: Not on file  . Number of children: 2  . Years of education: Not on file  . Highest  education level: Not on file  Occupational History  . Not on file  Tobacco Use  . Smoking status: Current Every Day Smoker    Packs/day: 0.25  . Smokeless tobacco: Never Used  Substance and Sexual Activity  . Alcohol use: Yes    Comment: occasionally  . Drug use: Not Currently  . Sexual activity: Not on file  Other Topics Concern  . Not on file  Social History Narrative  . Not on file   Social Determinants of Health   Financial Resource Strain:   . Difficulty of Paying Living Expenses: Not on file  Food Insecurity:   . Worried About Charity fundraiser in the Last Year: Not on file  . Ran Out of Food in the Last Year: Not on file  Transportation Needs:   . Lack of Transportation (Medical): Not on file  . Lack of Transportation (Non-Medical): Not on file  Physical Activity:   . Days of Exercise per Week: Not on file  . Minutes of Exercise per Session: Not on file  Stress:   . Feeling of Stress : Not on file  Social Connections:   . Frequency of Communication with Friends and Family: Not on file  . Frequency of Social Gatherings with Friends and Family: Not on file  . Attends Religious Services: Not on file  . Active Member of Clubs or Organizations: Not on file  . Attends Archivist Meetings: Not on file  . Marital Status: Not on file  Intimate Partner Violence:   . Fear of Current or Ex-Partner: Not on file  . Emotionally Abused: Not on file  . Physically Abused: Not on file  . Sexually Abused: Not on file    FAMILY HISTORY: Family History  Problem Relation Age of Onset  . Hypertension Mother   . Diabetes Father   . Hypertension Father   . Heart disease Father     ALLERGIES:  has No Known Allergies.  MEDICATIONS:  Current Outpatient Medications  Medication Sig Dispense Refill  . acetaminophen (TYLENOL) 500 MG tablet Take 1,000 mg by mouth every 6 (six) hours as needed for mild pain.    . carvedilol (COREG) 25 MG tablet Take 1 tablet (25 mg total)  by mouth 2 (two) times daily with a meal. 60 tablet 6  . furosemide (LASIX) 40 MG tablet Take 1 tablet (40 mg total) by mouth 2 (two) times daily. Must have office visit for refills 60 tablet 6  . hydrALAZINE (APRESOLINE) 25 MG tablet Take 1 tablet (25 mg total) by mouth 3 (three) times daily. 90 tablet 2  . imatinib (GLEEVEC) 100 MG tablet Take 2 tablets (200 mg total) by mouth daily. Take with meals and large glass of water. 60 tablet 3  . isosorbide mononitrate (IMDUR) 30 MG 24 hr tablet Take 1 tablet (30  mg total) by mouth daily. 30 tablet 6  . sacubitril-valsartan (ENTRESTO) 49-51 MG Take 1 tablet by mouth 2 (two) times daily. 60 tablet 11  . spironolactone (ALDACTONE) 25 MG tablet Take 1 tablet (25 mg total) by mouth daily. 30 tablet 5  . warfarin (COUMADIN) 5 MG tablet Take as directed by the Coumadin Clinic. Please make PCP appointment. 60 tablet 0   No current facility-administered medications for this visit.    REVIEW OF SYSTEMS:   A 10+ POINT REVIEW OF SYSTEMS WAS OBTAINED including neurology, dermatology, psychiatry, cardiac, respiratory, lymph, extremities, GI, GU, Musculoskeletal, constitutional, breasts, reproductive, HEENT.  All pertinent positives are noted in the HPI.  All others are negative.    PHYSICAL EXAMINATION: ECOG PERFORMANCE STATUS: 2 - Symptomatic, <50% confined to bed  Vitals:   05/16/19 1444  BP: (!) 150/82  Pulse: 97  Resp: 18  Temp: 97.8 F (36.6 C)  SpO2: 98%   Filed Weights   05/16/19 1444  Weight: 213 lb (96.6 kg)   .Body mass index is 38.96 kg/m.   Exam was given in a chair   GENERAL:alert, in no acute distress and comfortable, HOH SKIN: no acute rashes, no significant lesions EYES: conjunctiva are pink and non-injected, sclera anicteric OROPHARYNX: MMM, no exudates, no oropharyngeal erythema or ulceration NECK: supple, no JVD LYMPH:  no palpable lymphadenopathy in the cervical, axillary or inguinal regions LUNGS: clear to auscultation  b/l with normal respiratory effort HEART: regular rate & rhythm ABDOMEN:  normoactive bowel sounds , non tender, not distended. No palpable hepatosplenomegaly.  Extremity: no pedal edema PSYCH: alert & oriented x 3 with fluent speech NEURO: no focal motor/sensory deficits, some mild cognitive issues  LABORATORY DATA:  I have reviewed the data as listed  . CBC Latest Ref Rng & Units 05/16/2019 03/21/2019 02/13/2019  WBC 4.0 - 10.5 K/uL 7.1 8.1 6.1  Hemoglobin 12.0 - 15.0 g/dL 12.0 12.1 12.5  Hematocrit 36.0 - 46.0 % 38.9 40.0 41.8  Platelets 150 - 400 K/uL 253 283 255    . CMP Latest Ref Rng & Units 05/16/2019 05/07/2019 04/02/2019  Glucose 70 - 99 mg/dL 114(H) 99 94  BUN 6 - 20 mg/dL 12 5(L) 10  Creatinine 0.44 - 1.00 mg/dL 0.98 0.88 0.97  Sodium 135 - 145 mmol/L 145 144 141  Potassium 3.5 - 5.1 mmol/L 3.8 4.4 4.3  Chloride 98 - 111 mmol/L 104 107 107  CO2 22 - 32 mmol/L 33(H) 30 31  Calcium 8.9 - 10.3 mg/dL 8.9 8.6(L) 8.8(L)  Total Protein 6.5 - 8.1 g/dL 7.4 - -  Total Bilirubin 0.3 - 1.2 mg/dL 0.3 - -  Alkaline Phos 38 - 126 U/L 61 - -  AST 15 - 41 U/L 15 - -  ALT 0 - 44 U/L 11 - -   12/13/2018 BCR-ABL:   08/30/18 Initial BCR-ABL:    09/04/18 BM Bx:     07/14/18 BCR-ABL:     RADIOGRAPHIC STUDIES: I have personally reviewed the radiological images as listed and agreed with the findings in the report. No results found.  ASSESSMENT & PLAN:   49 y.o. female with  1. Recently diagnosed Chronic Myeloid Leukemia  Labs upon initial presentation from 08/17/18, WBC elevated at 43.2k, anemic with HGB at 10.5, PLT normal at 229k. ANC at 29.8k, Lymphs at 4.3k, Monocytes at 1.7k, Eosinophils at 900, Basophils ab 2.6k and Immature grans at 3.9k  07/14/18 BCR-ABL test revealed b2a2 at 16.7455%, b3a2 at 13.1258% and E1A2 at  0.0157% Reviewed the pt's previous labs: WBC have been increasing since earliest available labs in August 2019, with increasing basophilia and anemia 07/14/18  ECHO revealed LV EF of 20-25% 09/04/18 BM Bx revealed hypercellular bone marrow with myeloproliferative neoplasm consistent with chronic phase CML 08/30/18 Initial pre-treatment BCR-ABL revealed IS at 134.997% 12/13/2018 BCR-ABL revealed IS at 5.9101%  PLAN: -Discussed pt labwork today, 05/16/19; blood counts and chemistries are steady -Pt appears to be in hematologic remission. -Goal BCR-ABL to be less than 10% by 3 months, 12/13/2018 BCR-ABL at 5.9101% -The pt has no prohibitive toxicities from continuing 200mg  Imatinib at this time. -Given her inability to report side effects accurately, overall goals of care, and overall burden of medical comorbidities, will proceed with slow dose increase  -Continue f/u with Dr. Aundra Dubin for Coumadin management -Refill Imatinib -Will see back in 2 months with labs   2.  Patient Active Problem List   Diagnosis Date Noted  . CML (chronic myelocytic leukemia) ([place]) 08/24/2018  . Left ventricular thrombus without MI ([place]) 08/16/2018  . Leukocytosis 08/16/2018  . CHF (congestive heart failure) ([place]) 07/14/2018  . Coronary artery disease 07/14/2018  . Odontogenic infection 07/14/2018  . Essential (primary) hypertension 09/20/2017  . History of stroke 09/20/2017  - setup local PCP and cardiologist.   FOLLOW UP: RTC with Dr Irene Limbo with labs in 8 weeks  Orders Placed This Encounter  Procedures  . CBC with Differential/Platelet    Standing Status:   Future    Standing Expiration Date:   06/19/2020  . CMP ([place] only)    Standing Status:   Future    Standing Expiration Date:   05/15/2020  . bcr/abl - Molecular MD Lab    Standing Status:   Future    Standing Expiration Date:   06/19/2020    The total time spent in the appt was 15 minutes and more than 50% was on counseling and direct patient cares.  All of the patient's questions were answered with apparent satisfaction. The patient knows to call the clinic with any problems, questions or  concerns.    Sullivan Lone MD [place] AAHIVMS [facility] Hematology/Oncology Physician [facility]  (Office):       [phone] (Work cell):  [phone] (Fax):           [phone]  05/16/2019 3:32 PM  I, Yevette Edwards, am acting as a scribe for Dr. Sullivan Lone.   .I have reviewed the above documentation for accuracy and completeness, and I agree with the above. Brunetta Genera MD

## 2019-05-21 MED FILL — IMATINIB MESYLATE 100 MG TA: 100 | 30 days supply | Qty: 60 | Fill #0

## 2019-05-22 ENCOUNTER — Ambulatory Visit (HOSPITAL_COMMUNITY)
Admission: RE | Admit: 2019-05-22 | Discharge: 2019-05-22 | Disposition: A | Discharge status: Home / Self Care | Payer: Medicare Other | Source: Ambulatory Visit | Attending: Cardiology | Admitting: Cardiology

## 2019-05-22 ENCOUNTER — Other Ambulatory Visit: Payer: Self-pay

## 2019-05-22 ENCOUNTER — Other Ambulatory Visit (HOSPITAL_COMMUNITY): Payer: Self-pay

## 2019-05-22 ENCOUNTER — Encounter (HOSPITAL_COMMUNITY): Payer: Self-pay

## 2019-05-22 VITALS — BP 124/76 | HR 85 | Wt 214.2 lb

## 2019-05-22 DIAGNOSIS — F1021 Alcohol dependence, in remission: Secondary | ICD-10-CM | POA: Diagnosis not present

## 2019-05-22 DIAGNOSIS — Z833 Family history of diabetes mellitus: Secondary | ICD-10-CM | POA: Diagnosis not present

## 2019-05-22 DIAGNOSIS — Z8673 Personal history of transient ischemic attack (TIA), and cerebral infarction without residual deficits: Secondary | ICD-10-CM | POA: Diagnosis not present

## 2019-05-22 DIAGNOSIS — I110 Hypertensive heart disease with heart failure: Secondary | ICD-10-CM | POA: Insufficient documentation

## 2019-05-22 DIAGNOSIS — I509 Heart failure, unspecified: Secondary | ICD-10-CM | POA: Diagnosis not present

## 2019-05-22 DIAGNOSIS — F17210 Nicotine dependence, cigarettes, uncomplicated: Secondary | ICD-10-CM | POA: Diagnosis not present

## 2019-05-22 DIAGNOSIS — F17200 Nicotine dependence, unspecified, uncomplicated: Secondary | ICD-10-CM

## 2019-05-22 DIAGNOSIS — Z79899 Other long term (current) drug therapy: Secondary | ICD-10-CM | POA: Diagnosis not present

## 2019-05-22 DIAGNOSIS — I5022 Chronic systolic (congestive) heart failure: Secondary | ICD-10-CM | POA: Diagnosis present

## 2019-05-22 DIAGNOSIS — C9210 Chronic myeloid leukemia, BCR/ABL-positive, not having achieved remission: Secondary | ICD-10-CM | POA: Insufficient documentation

## 2019-05-22 DIAGNOSIS — I240 Acute coronary thrombosis not resulting in myocardial infarction: Secondary | ICD-10-CM | POA: Diagnosis not present

## 2019-05-22 DIAGNOSIS — Z8249 Family history of ischemic heart disease and other diseases of the circulatory system: Secondary | ICD-10-CM | POA: Diagnosis not present

## 2019-05-22 DIAGNOSIS — Z7901 Long term (current) use of anticoagulants: Secondary | ICD-10-CM | POA: Insufficient documentation

## 2019-05-22 DIAGNOSIS — I428 Other cardiomyopathies: Secondary | ICD-10-CM | POA: Insufficient documentation

## 2019-05-22 LAB — BASIC METABOLIC PANEL
Anion gap: 8 (ref 5–15)
BUN: 10 mg/dL (ref 6–20)
CO2: 28 mmol/L (ref 22–32)
Calcium: 8.9 mg/dL (ref 8.9–10.3)
Chloride: 104 mmol/L (ref 98–111)
Creatinine, Ser: 0.78 mg/dL (ref 0.44–1.00)
GFR calc Af Amer: >60 mL/min (ref >60)
GFR calc non Af Amer: >60 mL/min (ref >60)
Glucose, Bld: 138 mg/dL — ABNORMAL HIGH (ref 70–99)
Potassium: 3.6 mmol/L (ref 3.5–5.1)
Sodium: 140 mmol/L (ref 135–145)

## 2019-05-22 LAB — PROTIME-INR
INR: 1 (ref 0.8–1.2)
Prothrombin Time: 12.9 seconds (ref 11.4–15.2)

## 2019-05-22 MED ORDER — SACUBITRIL-VALSARTAN 97-103 MG PO TABS: 1.0000 | ORAL_TABLET | Freq: Two times a day (BID) | ORAL | 3 refills | Status: AC

## 2019-05-22 NOTE — Patient Instructions (Addendum)
Lab work done today. We will notify you of any abnormal lab work. No news is good news!  Lab work will need to be done again in 7 days.  INCREASE Entresto to 97-103mg  tab two times daily.  Your physician has requested that you have a cardiac MRI. Cardiac MRI uses a computer to create images of your heart as its beating, producing both still and moving pictures of your heart and major blood vessels. For further information please visit [email or web address] Please follow the instruction sheet given to you today for more information. This will be scheduled after your insurance approves it.  Please follow up with the [facility] in 4 weeks.  At the [facility], you and your health needs are our priority. As part of our continuing mission to provide you with exceptional heart care, we have created designated Provider Care Teams. These Care Teams include your primary Cardiologist (physician) and Advanced Practice Providers (APPs- Physician Assistants and Nurse Practitioners) who all work together to provide you with the care you need, when you need it.   You may see any of the following providers on your designated Care Team at your next follow up: Marland Kitchen Dr Glori Bickers . Dr Loralie Champagne . Darrick Grinder, NP . Lyda Jester, PA . Audry Riles, PharmD   Please be sure to bring in all your medications bottles to every appointment.

## 2019-05-22 NOTE — Progress Notes (Signed)
Paramedicine Encounter   Patient ID: Charlotte Wyatt , female,   DOB: 06/08/1970,48 y.o.,  MRN: [id number]   Met patient in clinic today with provider.  B/p-124/76 p-85 sp02-95 Weight @ clinic-214   Call daughter to sch visits--- Daughter-lucretia [phone]  First time meeting pt, last week she was at doc office when I called her and she didn't return my call.  She reports living here with her mom.  her daughter and grand child are close by.  Her daughter is here with her in clinic.  She had to take her son home as he wasn't able to come in as visitor.  She fills her own pill box.  She denies any issues with affording rent, no issues with food.  She lives at another town but has been here since she got diagnosed with leukemia.  She denies increased sob.  Last visit dr mclean restarted her fluid pill. She states swelling has gone down.  Post stroke--hearing loss--it has improved some. These masks make it more difficult to communicate with her. Her speech was affected as well and she gets her words mixed up at times.  Amy is increasing her entresto 97/103mg.   She gets her meds from [facility]. Daughter reports her meds are affordable and have a very low co-pay.  Will also check INR today, the last time she got it checked according to [facility] records it was back in November.  She wants to know about a home INR machine so she will f/u with PCP at her next appointment to see if insurance covers it.   Katie Lynch, EMT-Paramedic [phone] 05/23/2019       

## 2019-05-22 NOTE — Progress Notes (Signed)
PCP: Dr. Joya Gaskins Oncology: Dr. Irene Limbo Cardiology: Dr. Aundra Dubin  49 y.o. 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 Irene Limbo for treatment of CHF.  Patient has a long history of cardiomyopathy.  She was initially treated in [place].  Cath in 9/09 showed no significant coronary disease.  She was found to have LV thrombus and had a CVA in 2013, now on warfarin.  Last echo in 3/20 showed EF 20-25% with moderate LV dilation and moderately decreased RV systolic function.  She now lives in [place].  She was last hospitalized for CHF in 4/20.   She used to be a heavy drinker but has not drunk for years now.  She still smokes a few cigarettes/day. Of note after CVA she lost her hearing for 2 years and then slowly regained some hearing. She also reads lips.    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   Today she returns for 2 week HF follow up with her daughter. She was seen in the clinic 2 weeks ago and started on lasix 40 mg twice a day and spiro was increased to 25 mg daily. Overall feeling fine. Denies SOB/PND/Orthopnea. 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 Appetite ok. No fever or chills. She has not been weighing at home. Taking all medications. Lives with her Mother. She does not drive.   Labs (10/20): hgb 12.5, K 4.2, creatinine 1.02 Labs (11/20): K 4.3, creatinine 0.97 Labs (05/16/19): K 3.8 Creatinine 0.98  PMH: 1. Ectopic pregnancy in 2003 2. Active smoker: Few cigarettes/day.  3. Chronic systolic CHF: Nonischemic cardiomyopathy.  Long-standing.  - LHC (01/2008): No significant CAD.  - Echo (3/20): EF 20-25%, moderate LV dilation, moderately decreased RV systolic function.  4. LV thrombus.  5. H/o CVA 2013 6. CML: Imatinib.  7. Prior heavy ETOH.  8. HTN  SH: From [place] but now lives in [place] with her sister.  Daughter lives here as well.  Prior heavy ETOH.  Smokes a  few cigarettes/day.   Family History  Problem Relation Age of Onset  . Hypertension Mother   . Diabetes Father   . Hypertension Father   . Heart disease Father    ROS: All systems reviewed and negative except as per HPI.   Current Outpatient Medications  Medication Sig Dispense Refill  . acetaminophen (TYLENOL) 500 MG tablet Take 1,000 mg by mouth every 6 (six) hours as needed for mild pain.    . carvedilol (COREG) 25 MG tablet Take 1 tablet (25 mg total) by mouth 2 (two) times daily with a meal. 60 tablet 6  . furosemide (LASIX) 40 MG tablet Take 1 tablet (40 mg total) by mouth 2 (two) times daily. Must have office visit for refills 60 tablet 6  . hydrALAZINE (APRESOLINE) 25 MG tablet Take 1 tablet (25 mg total) by mouth 3 (three) times daily. 90 tablet 2  . imatinib (GLEEVEC) 100 MG tablet Take 2 tablets (200 mg total) by mouth daily. Take with meals and large glass of water. 60 tablet 3  . isosorbide mononitrate (IMDUR) 30 MG 24 hr tablet Take 1 tablet (30 mg total) by mouth daily. 30 tablet 6  . sacubitril-valsartan (ENTRESTO) 49-51 MG Take 1 tablet by mouth 2 (two) times daily. 60 tablet 11  . spironolactone (ALDACTONE) 25 MG tablet Take 1 tablet (25 mg total) by mouth daily. 30 tablet 5  . warfarin (COUMADIN)  5 MG tablet Take as directed by the Coumadin Clinic. Please make PCP appointment. 60 tablet 0   No current facility-administered medications for this encounter.   BP 124/76   Pulse 85   Wt 97.2 kg (214 lb 3.2 oz)   SpO2 95%   BMI 39.18 kg/m   Wt Readings from Last 3 Encounters:  05/22/19 97.2 kg (214 lb 3.2 oz)  05/16/19 96.6 kg (213 lb)  05/07/19 100.9 kg (222 lb 6.4 oz)   General:  Well appearing. No resp difficulty HEENT: normal Neck: supple. no JVD. Carotids 2+ bilat; no bruits. No lymphadenopathy or thryomegaly appreciated. Cor: PMI nondisplaced. Regular rate & rhythm. No rubs, gallops or murmurs. Lungs: clear Abdomen: soft, nontender, nondistended. No  hepatosplenomegaly. No bruits or masses. Good bowel sounds. Extremities: no cyanosis, clubbing, rash, edema Neuro: alert & orientedx3, cranial nerves grossly intact. moves all 4 extremities w/o difficulty. Affect pleasant    Assessment/Plan: 1. Chronic systolic CHF: Nonischemic cardiomyopathy.  ?Related to HTN or prior ETOH abuse.  Last echo in 3/20 with EF 20-25% with moderate RV dysfunction.  Long-standing cardiomyopathy (found in 2009).   - NYHA II. Volume status stable. Continue lasix 40 mg twice a day.   Continue Coreg 25 mg bid.  - Continue hydralazine 25 mg tid and Imdur 30 mg daily.  - Increase entresto to 97-103 mg twice a day.  - Continue spironolactone to 25 mg daily  - Consider adding farxiga next visit.  - With long-standing cardiomyopathy and young age, think she would be reasonable ICD candidate as CML is stable with hematological response.   - Refer to EP for ICD. Narrow QRS so not CRT candidate.   - Needs cardiac MRI to assess for infiltrative disease. Set up today.  2. LV thrombus: With history of CVA.   - Continue warfarin.  3. CML: She has been on imatinib.  This can cause volume retention/edema, but she has tolerated well so far.  4. Smoking: Discussed smoking cessation.   5. Upper eyelid swelling: Resolved.   Discussed referral to EP and [facility]RI.   Follow up in 4 weeks. Check BMET. Referred to HF Paramedicine but we may be abe to   .   [name] NP-C  05/22/2019

## 2019-05-22 NOTE — Progress Notes (Signed)
Paramedicine Initial Assessment:  Housing:  In what kind of housing do you live? House/apt/trailer/shelter? apartment  Do you rent/pay a mortgage/own? rent  Do you live with anyone? Daughter and mother  Are you currently worried about losing your housing? no  Within the past 12 months have you ever stayed outside, in a car, tent, a shelter, or temporarily with someone?no   Within the past 12 months have you been unable to get utilities when it was really needed? no  Social:  What is your current marital status? single  Do you have any children? daughter  Moved from [place] to [place] because she was diagnosed with leukemia so came here were she has support system in her daughter and mother.  Food:  Within the past 12 months were you ever worried that food would run out before you got money to buy more? no  Within the past 60months have you run out of food and didn't have money to buy more? no  Income:  What is your current source of income? disability  How hard is it for you to pay for the basics like food housing, medical care, and utilities? Not very hard  Do you have outstanding medical bills? no  Insurance:  Are you currently insured? Yes medicare and medicaid  Do you have prescription coverage? yes  If no insurance, have you applied for coverage (Medicaid, disability, marketplace etc)?  Transportation:  Do you have transportation to your medical appointments? Yes   If yes, how? daughter  In the past 12 months has lack of transportation kept you from medical appts or from getting medications? no  In the past 12 months has lack of transportation kept you from meetings, work, or getting things you needed? no   Daily Health Needs: Do you have a working scale at home? yes  How do you manage your medications at home? Pill box which she manages herself  Do you ever take your medications differently than prescribed? no  Do you have issues affording your  medications? no  If yes, has this ever prevented you from obtaining medications? N/A  Do you have any concerns with mobility at home? No- very independent per daughter   Do you use any assistive devices at home or have PCS at home? no   Are there any additional barriers you see to getting the care you need? Not at this time  CSW will continue to follow through paramedicine program and assist as needed.  Jorge Ny, LCSW Clinical Social Worker [facility] Desk#: [phone] Cell#: [phone]

## 2019-05-31 ENCOUNTER — Ambulatory Visit (HOSPITAL_COMMUNITY)
Admission: RE | Admit: 2019-05-31 | Discharge: 2019-05-31 | Disposition: A | Discharge status: Home / Self Care | Payer: Medicare Other | Source: Ambulatory Visit | Attending: Internal Medicine | Admitting: Internal Medicine

## 2019-05-31 ENCOUNTER — Other Ambulatory Visit: Payer: Self-pay

## 2019-05-31 DIAGNOSIS — I509 Heart failure, unspecified: Secondary | ICD-10-CM | POA: Insufficient documentation

## 2019-05-31 LAB — BASIC METABOLIC PANEL
Anion gap: 9 (ref 5–15)
BUN: 12 mg/dL (ref 6–20)
CO2: 27 mmol/L (ref 22–32)
Calcium: 8.3 mg/dL — ABNORMAL LOW (ref 8.9–10.3)
Chloride: 102 mmol/L (ref 98–111)
Creatinine, Ser: 0.94 mg/dL (ref 0.44–1.00)
GFR calc Af Amer: >60 mL/min (ref >60)
GFR calc non Af Amer: >60 mL/min (ref >60)
Glucose, Bld: 91 mg/dL (ref 70–99)
Potassium: 3.7 mmol/L (ref 3.5–5.1)
Sodium: 138 mmol/L (ref 135–145)

## 2019-06-05 ENCOUNTER — Ambulatory Visit: Payer: Medicare Other | Attending: Family Medicine | Admitting: Family Medicine

## 2019-06-05 ENCOUNTER — Other Ambulatory Visit: Payer: Self-pay

## 2019-06-05 ENCOUNTER — Encounter: Payer: Self-pay | Admitting: Family Medicine

## 2019-06-05 VITALS — BP 126/78 | HR 74 | Ht 62.0 in | Wt 219.0 lb

## 2019-06-05 DIAGNOSIS — R7303 Prediabetes: Secondary | ICD-10-CM

## 2019-06-05 DIAGNOSIS — C9210 Chronic myeloid leukemia, BCR/ABL-positive, not having achieved remission: Secondary | ICD-10-CM

## 2019-06-05 DIAGNOSIS — I5022 Chronic systolic (congestive) heart failure: Secondary | ICD-10-CM

## 2019-06-05 DIAGNOSIS — I110 Hypertensive heart disease with heart failure: Secondary | ICD-10-CM

## 2019-06-05 DIAGNOSIS — I240 Acute coronary thrombosis not resulting in myocardial infarction: Secondary | ICD-10-CM | POA: Diagnosis not present

## 2019-06-05 LAB — POCT GLYCOSYLATED HEMOGLOBIN (HGB A1C): HbA1c, POC (controlled diabetic range): 6.4 % (ref 0.0–7.0)

## 2019-06-05 LAB — POCT INR: INR: 2.4 (ref 2.0–3.0)

## 2019-06-05 MED ORDER — ATORVASTATIN CALCIUM 40 MG PO TABS: 40.0000 mg | ORAL_TABLET | Freq: Every day | ORAL | 3 refills | Status: AC

## 2019-06-05 MED FILL — ATORVASTATIN CALCIUM 40 MG: 40 | 90 days supply | Qty: 90 | Fill #0

## 2019-06-05 NOTE — Progress Notes (Signed)
Evaluation and management procedures were performed by me with DNP Student in attendance, note written by DNP student under my supervision and collaboration. I have reviewed the note and I agree with the management and plan.   Patient with above medical history presenting to establish care. On anticoagulation with Coumadin for LV thrombus. INR is therapeutic today; this will be monitored by the clinical pharmacist in-house.  Charlotte Wyatt is prediabetic with an A1c of 6.4; we will work on lifestyle modifications initially and repeat A1c in 3 months but if this trends up we will commence metformin. Commenced on moderate intensity statin for secondary prevention. Charlotte Wyatt will continue to follow-up with oncology for [facility] and cardiology for CHF.  Charlott Rakes, MD, FAAFP. [facility] and [place], [place]   06/06/2019, 12:31 PM

## 2019-06-05 NOTE — Progress Notes (Signed)
New Patient Office Visit  Subjective:  Patient ID: Charlotte Wyatt Lab, female    DOB: May 24, 1970  Age: 49 y.o. MRN: [id number]  CC:  Chief Complaint  Patient presents with  . Establish Care    HPI Charlotte Wyatt is a 49 year old patient with a history of hypertension, congestive heart failure (EF 20-25% in 07/2018), left ventricular thrombus on warfarin (INR 2.4 today),chronic myelocytic leukemia, and history of stroke with chronic hearing loss.  She has recently moved to [place] from [place], [place] and presents today to establish care. Patient is followed by [facility] for management of cardiomyopathy, LV thrombus, and CHF.  Last visit 05/22/2019.  Denies shortness of breath and chest pain.  Endorses compliance with medications.  INR is monitored by coumadin clinic at [facility]. Smokes 3-4 cigarettes per day and is not ready to quit. Hypertension is controlled on current medication regimen. Chronic myelocytic leukemia is noted to be in hematologic remission on Imantinib per Dr. Irene Limbo with [facility].  Next appointment is 07/2019.   No acute complaints today.  She would like to schedule a PAP smear.   Past Medical History:  Diagnosis Date  . CAD (coronary artery disease)   . Cardiomyopathy, secondary ([place])   . Central hearing loss   . CHF (congestive heart failure) ([place])   . Essential hypertension   . History of stroke   . Menopausal symptoms   . Mixed hyperlipidemia   . Stroke [facility])     Past Surgical History:  Procedure Laterality Date  . ATRIAL CARDIAC PACEMAKER INSERTION    . TUBAL LIGATION      Family History  Problem Relation Age of Onset  . Hypertension Mother   . Diabetes Father   . Hypertension Father   . Heart disease Father     Social History   Socioeconomic History  . Marital status: Single    Spouse name: Not on file  . Number of children: 2  . Years of education: Not on file  . Highest education level: Not on file   Occupational History  . Not on file  Tobacco Use  . Smoking status: Current Every Day Smoker    Packs/day: 0.25  . Smokeless tobacco: Never Used  Substance and Sexual Activity  . Alcohol use: Yes    Comment: occasionally  . Drug use: Not Currently  . Sexual activity: Not on file  Other Topics Concern  . Not on file  Social History Narrative  . Not on file   Social Determinants of Health   Financial Resource Strain:   . Difficulty of Paying Living Expenses: Not on file  Food Insecurity:   . Worried About Charity fundraiser in the Last Year: Not on file  . Ran Out of Food in the Last Year: Not on file  Transportation Needs:   . Lack of Transportation (Medical): Not on file  . Lack of Transportation (Non-Medical): Not on file  Physical Activity:   . Days of Exercise per Week: Not on file  . Minutes of Exercise per Session: Not on file  Stress:   . Feeling of Stress : Not on file  Social Connections:   . Frequency of Communication with Friends and Family: Not on file  . Frequency of Social Gatherings with Friends and Family: Not on file  . Attends Religious Services: Not on file  . Active Member of Clubs or Organizations: Not on file  . Attends Archivist Meetings: Not  on file  . Marital Status: Not on file  Intimate Partner Violence:   . Fear of Current or Ex-Partner: Not on file  . Emotionally Abused: Not on file  . Physically Abused: Not on file  . Sexually Abused: Not on file    ROS Review of Systems  Constitutional: Negative for fatigue, fever and unexpected weight change.  HENT: Negative for congestion, rhinorrhea, sinus pressure and sinus pain.   Eyes: Negative for visual disturbance.  Respiratory: Negative for cough, chest tightness and shortness of breath.   Cardiovascular: Negative for chest pain, palpitations and leg swelling.  Gastrointestinal: Negative for abdominal distention, abdominal pain, constipation, diarrhea, nausea and vomiting.   Endocrine: Negative for polydipsia and polyuria.  Genitourinary: Negative for decreased urine volume, difficulty urinating and dysuria.  Musculoskeletal: Negative for arthralgias and myalgias.  Skin: Negative for color change and rash.  Neurological: Negative for dizziness, tremors, weakness and numbness.  Hematological: Does not bruise/bleed easily.  Psychiatric/Behavioral: Negative for agitation and behavioral problems.    Objective:   Today's Vitals: BP 126/78   Pulse 74   Ht 5\' 2"  (1.575 m)   Wt 219 lb (99.3 kg)   SpO2 97%   BMI 40.06 kg/m   Physical Exam Vitals and nursing note reviewed.  Constitutional:      Appearance: Normal appearance. She is obese. She is not ill-appearing.  HENT:     Head: Normocephalic and atraumatic.  Eyes:     Extraocular Movements: Extraocular movements intact.     Conjunctiva/sclera: Conjunctivae normal.     Pupils: Pupils are equal, round, and reactive to light.     Comments: Bilateral eyelid edema  Cardiovascular:     Rate and Rhythm: Normal rate and regular rhythm.     Pulses: Normal pulses.     Heart sounds: Normal heart sounds.  Pulmonary:     Effort: Pulmonary effort is normal. No respiratory distress.     Breath sounds: Normal breath sounds. No wheezing.  Abdominal:     General: Bowel sounds are normal. There is no distension.     Palpations: Abdomen is soft.     Tenderness: There is no abdominal tenderness.  Musculoskeletal:        General: No swelling or tenderness. Normal range of motion.     Cervical back: Normal range of motion and neck supple.  Skin:    General: Skin is warm and dry.     Findings: No erythema or rash.  Neurological:     General: No focal deficit present.     Mental Status: She is alert and oriented to person, place, and time.  Psychiatric:        Mood and Affect: Mood normal.        Behavior: Behavior normal.     Assessment & Plan:   1. Left ventricular thrombus without MI (HCC) Stable Continue  warfarin INR monitored by coumadin clinic at [facility] INR 2.4 today Keep your follow up appointment with [facility] - INR  2. Prediabetes Initial diagnosis Educated patient on the importance of lifestyle modifications to prevent progression to diabetes. Counseled on Diabetic diet, my plate method, [id number] minutes of moderate intensity exercise/week.  Provided written information as well. It is recommended that you undergo annual eye exams and annual foot exams.  - POCT glycosylated hemoglobin (Hb A1C)  3. 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 Discussed medication compliance, adverse effects. - Lipid panel  4. CML (chronic myelocytic leukemia) ([place]) Hematologic remission per hematology/oncology provider's note Keep your follow up with [facility] Continue imatinib as recommended by your oncologist  Outpatient Encounter Medications as of 06/05/2019  Medication Sig  . acetaminophen (TYLENOL) 500 MG tablet Take 1,000 mg by mouth every 6 (six) hours as needed for mild pain.  Marland Kitchen atorvastatin (LIPITOR) 40 MG tablet Take 1 tablet (40 mg total) by mouth daily.  . carvedilol (COREG) 25 MG tablet Take 1 tablet (25 mg total) by mouth 2 (two) times daily with a meal.  . furosemide (LASIX) 40 MG tablet Take 1 tablet (40 mg total) by mouth 2 (two) times daily. Must have office visit for refills  . hydrALAZINE (APRESOLINE) 25 MG tablet Take 1 tablet (25 mg total) by mouth 3 (three) times daily.  Marland Kitchen imatinib (GLEEVEC) 100 MG tablet Take 2 tablets (200 mg total) by mouth daily. Take with meals and large glass of water.  . isosorbide mononitrate (IMDUR) 30 MG 24 hr tablet Take 1 tablet (30 mg total) by mouth daily.  .  sacubitril-valsartan (ENTRESTO) 97-103 MG Take 1 tablet by mouth 2 (two) times daily.  Marland Kitchen spironolactone (ALDACTONE) 25 MG tablet Take 1 tablet (25 mg total) by mouth daily.  Marland Kitchen warfarin (COUMADIN) 5 MG tablet Take as directed by the Coumadin Clinic. Please make PCP appointment.   No facility-administered encounter medications on file as of 06/05/2019.    Follow-up: Return for see luke in 1 month; annual physical in 1 month.   Tomasita Morrow, RN

## 2019-06-05 NOTE — Patient Instructions (Addendum)
Continue current medication per luke  See luke in 1 month for follow up.  Prediabetes Eating Plan Prediabetes is a condition that causes blood sugar (glucose) levels to be higher than normal. This increases the risk for developing diabetes. In order to prevent diabetes from developing, your health care provider may recommend a diet and other lifestyle changes to help you:  Control your blood glucose levels.  Improve your cholesterol levels.  Manage your blood pressure. Your health care provider may recommend working with a diet and nutrition specialist (dietitian) to make a meal plan that is best for you. What are tips for following this plan? Lifestyle  Set weight loss goals with the help of your health care team. It is recommended that most people with prediabetes lose 7% of their current body weight.  Exercise for at least 30 minutes at least 5 days a week.  Attend a support group or seek ongoing support from a mental health counselor.  Take over-the-counter and prescription medicines only as told by your health care provider. Reading food labels  Read food labels to check the amount of fat, salt (sodium), and sugar in prepackaged foods. Avoid foods that have: ? Saturated fats. ? Trans fats. ? Added sugars.  Avoid foods that have more than 300 milligrams (mg) of sodium per serving. Limit your daily sodium intake to less than 2,300 mg each day. Shopping  Avoid buying pre-made and processed foods. Cooking  Cook with olive oil. Do not use butter, lard, or ghee.  Bake, broil, grill, or boil foods. Avoid frying. Meal planning   Work with your dietitian to develop an eating plan that is right for you. This may include: ? Tracking how many calories you take in. Use a food diary, notebook, or mobile application to track what you eat at each meal. ? Using the glycemic index (GI) to plan your meals. The index tells you how quickly a food will raise your blood glucose. Choose  low-GI foods. These foods take a longer time to raise blood glucose.  Consider following a Mediterranean diet. This diet includes: ? Several servings each day of fresh fruits and vegetables. ? Eating fish at least twice a week. ? Several servings each day of whole grains, beans, nuts, and seeds. ? Using olive oil instead of other fats. ? Moderate alcohol consumption. ? Eating small amounts of red meat and whole-fat dairy.  If you have high blood pressure, you may need to limit your sodium intake or follow a diet such as the DASH eating plan. DASH is an eating plan that aims to lower high blood pressure. What foods are recommended? The items listed below may not be a complete list. Talk with your dietitian about what dietary choices are best for you. Grains Whole grains, such as whole-wheat or whole-grain breads, crackers, cereals, and pasta. Unsweetened oatmeal. Bulgur. Barley. Quinoa. Brown rice. Corn or whole-wheat flour tortillas or taco shells. Vegetables Lettuce. Spinach. Peas. Beets. Cauliflower. Cabbage. Broccoli. Carrots. Tomatoes. Squash. Eggplant. Herbs. Peppers. Onions. Cucumbers. Brussels sprouts. Fruits Berries. Bananas. Apples. Oranges. Grapes. Papaya. Mango. Pomegranate. Kiwi. Grapefruit. Cherries. Meats and other protein foods Seafood. Poultry without skin. Lean cuts of pork and beef. Tofu. Eggs. Nuts. Beans. Dairy Low-fat or fat-free dairy products, such as yogurt, cottage cheese, and cheese. Beverages Water. Tea. Coffee. Sugar-free or diet soda. Seltzer water. Lowfat or no-fat milk. Milk alternatives, such as soy or almond milk. Fats and oils Olive oil. Canola oil. Sunflower oil. Grapeseed oil. Avocado. Walnuts. Sweets  and desserts Sugar-free or low-fat pudding. Sugar-free or low-fat ice cream and other frozen treats. Seasoning and other foods Herbs. Sodium-free spices. Mustard. Relish. Low-fat, low-sugar ketchup. Low-fat, low-sugar barbecue sauce. Low-fat or fat-free  mayonnaise. What foods are not recommended? The items listed below may not be a complete list. Talk with your dietitian about what dietary choices are best for you. Grains Refined white flour and flour products, such as bread, pasta, snack foods, and cereals. Vegetables Canned vegetables. Frozen vegetables with butter or cream sauce. Fruits Fruits canned with syrup. Meats and other protein foods Fatty cuts of meat. Poultry with skin. Breaded or fried meat. Processed meats. Dairy Full-fat yogurt, cheese, or milk. Beverages Sweetened drinks, such as sweet iced tea and soda. Fats and oils Butter. Lard. Ghee. Sweets and desserts Baked goods, such as cake, cupcakes, pastries, cookies, and cheesecake. Seasoning and other foods Spice mixes with added salt. Ketchup. Barbecue sauce. Mayonnaise. Summary  To prevent diabetes from developing, you may need to make diet and other lifestyle changes to help control blood sugar, improve cholesterol levels, and manage your blood pressure.  Set weight loss goals with the help of your health care team. It is recommended that most people with prediabetes lose 7 percent of their current body weight.  Consider following a Mediterranean diet that includes plenty of fresh fruits and vegetables, whole grains, beans, nuts, seeds, fish, lean meat, low-fat dairy, and healthy oils. This information is not intended to replace advice given to you by your health care provider. Make sure you discuss any questions you have with your health care provider. Document Revised: 08/18/2018 Document Reviewed: 06/30/2016 Elsevier Patient Education  2020 [employer].

## 2019-06-06 LAB — LIPID PANEL
Chol/HDL Ratio: 4 ratio (ref 0.0–4.4)
Cholesterol, Total: 178 mg/dL (ref 100–199)
HDL: 44 mg/dL (ref >39)
LDL Chol Calc (NIH): 118 mg/dL — ABNORMAL HIGH (ref 0–99)
Triglycerides: 89 mg/dL (ref 0–149)
VLDL Cholesterol Cal: 16 mg/dL (ref 5–40)

## 2019-06-07 ENCOUNTER — Telehealth: Payer: Self-pay

## 2019-06-07 ENCOUNTER — Other Ambulatory Visit (HOSPITAL_COMMUNITY): Payer: Self-pay

## 2019-06-07 ENCOUNTER — Encounter: Payer: Self-pay | Admitting: Cardiology

## 2019-06-07 NOTE — Progress Notes (Signed)
Paramedicine Encounter    Patient ID: Charlotte Wyatt, female    DOB: 1970-05-16, 49 y.o.   MRN: [id number]   Patient Care Team: Charlott Rakes, MD as PCP - General (Family Medicine) Jorge Ny, LCSW as Social Worker (Licensed Clinical Social Worker)  Patient Active Problem List   Diagnosis Date Noted  . CML (chronic myelocytic leukemia) ([place]) 08/24/2018  . Left ventricular thrombus without MI ([place]) 08/16/2018  . Leukocytosis 08/16/2018  . CHF (congestive heart failure) ([place]) 07/14/2018  . Coronary artery disease 07/14/2018  . Odontogenic infection 07/14/2018  . Essential (primary) hypertension 09/20/2017  . History of stroke 09/20/2017    Current Outpatient Medications:  .  acetaminophen (TYLENOL) 500 MG tablet, Take 1,000 mg by mouth every 6 (six) hours as needed for mild pain., Disp: , Rfl:  .  atorvastatin (LIPITOR) 40 MG tablet, Take 1 tablet (40 mg total) by mouth daily., Disp: 30 tablet, Rfl: 3 .  carvedilol (COREG) 25 MG tablet, Take 1 tablet (25 mg total) by mouth 2 (two) times daily with a meal., Disp: 60 tablet, Rfl: 6 .  furosemide (LASIX) 40 MG tablet, Take 1 tablet (40 mg total) by mouth 2 (two) times daily. Must have office visit for refills, Disp: 60 tablet, Rfl: 6 .  hydrALAZINE (APRESOLINE) 25 MG tablet, Take 1 tablet (25 mg total) by mouth 3 (three) times daily., Disp: 90 tablet, Rfl: 2 .  imatinib (GLEEVEC) 100 MG tablet, Take 2 tablets (200 mg total) by mouth daily. Take with meals and large glass of water., Disp: 60 tablet, Rfl: 3 .  isosorbide mononitrate (IMDUR) 30 MG 24 hr tablet, Take 1 tablet (30 mg total) by mouth daily., Disp: 30 tablet, Rfl: 6 .  sacubitril-valsartan (ENTRESTO) 97-103 MG, Take 1 tablet by mouth 2 (two) times daily., Disp: 180 tablet, Rfl: 3 .  spironolactone (ALDACTONE) 25 MG tablet, Take 1 tablet (25 mg total) by mouth daily., Disp: 30 tablet, Rfl: 5 .  warfarin (COUMADIN) 5 MG tablet, Take as directed by the Coumadin Clinic. Please  make PCP appointment., Disp: 60 tablet, Rfl: 0 No Known Allergies    Social History   Socioeconomic History  . Marital status: Single    Spouse name: Not on file  . Number of children: 2  . Years of education: Not on file  . Highest education level: Not on file  Occupational History  . Not on file  Tobacco Use  . Smoking status: Current Every Day Smoker    Packs/day: 0.25  . Smokeless tobacco: Never Used  Substance and Sexual Activity  . Alcohol use: Yes    Comment: occasionally  . Drug use: Not Currently  . Sexual activity: Not on file  Other Topics Concern  . Not on file  Social History Narrative  . Not on file   Social Determinants of Health   Financial Resource Strain:   . Difficulty of Paying Living Expenses: Not on file  Food Insecurity:   . Worried About Charity fundraiser in the Last Year: Not on file  . Ran Out of Food in the Last Year: Not on file  Transportation Needs:   . Lack of Transportation (Medical): Not on file  . Lack of Transportation (Non-Medical): Not on file  Physical Activity:   . Days of Exercise per Week: Not on file  . Minutes of Exercise per Session: Not on file  Stress:   . Feeling of Stress : Not on file  Social Connections:   .  Frequency of Communication with Friends and Family: Not on file  . Frequency of Social Gatherings with Friends and Family: Not on file  . Attends Religious Services: Not on file  . Active Member of Clubs or Organizations: Not on file  . Attends Archivist Meetings: Not on file  . Marital Status: Not on file  Intimate Partner Violence:   . Fear of Current or Ex-Partner: Not on file  . Emotionally Abused: Not on file  . Physically Abused: Not on file  . Sexually Abused: Not on file    Physical Exam      Future Appointments  Date Time Provider [place]  06/19/2019  3:00 PM [facility] PA/NP [facility] None  07/11/2019  9:30 AM Tresa Endo, RPH-CPP [facility] None  07/11/2019 10:10  AM Charlott Rakes, MD [facility] None  07/11/2019  2:30 PM [facility] LAB 5 [facility] None  07/11/2019  3:00 PM Brunetta Genera, MD [facility] None    BP (!) 138/92   Pulse 76   Temp 97.8 F (36.6 C)   Resp 16   Wt 217 lb (98.4 kg)   SpO2 98%   BMI 39.69 kg/m   Weight yesterday-219 @ PCP office  Last visit weight-214  First visit with pt in the home.  Pt reports she is doing ok. She lives here with her mother.  She states she gets sob upon long distance walking. Just got back from grocery store when I arrived.  meds verified, she does her own pill box-shes doing great. no mistakes noted.  Pt very comfortable managing her pills.  No c/p, no dizziness.  She goes back in a month for INR check.   Marylouise Stacks, [facility] Paramedic  06/07/19

## 2019-06-07 NOTE — Telephone Encounter (Signed)
Patient was called and message states that voicemail is currently full.

## 2019-06-07 NOTE — Telephone Encounter (Signed)
-----   Message from Charlott Rakes, MD sent at 06/06/2019 11:54 AM EST ----- Total cholesterol is normal but bad cholesterol (LDL ) is slightly elevated.  Please advised to comply with Lipitor low-cholesterol diet.

## 2019-06-11 MED FILL — WARFARIN SODIUM 5 MG TABLET: 5 | 30 days supply | Qty: 45 | Fill #0

## 2019-06-14 MED FILL — IMATINIB MESYLATE 100 MG TA: 100 | 30 days supply | Qty: 60 | Fill #1

## 2019-06-18 MED FILL — CARVEDILOL 25 MG TABLET: 25 | 30 days supply | Qty: 60 | Fill #3

## 2019-06-19 ENCOUNTER — Ambulatory Visit (HOSPITAL_COMMUNITY)
Admission: RE | Admit: 2019-06-19 | Discharge: 2019-06-19 | Disposition: A | Discharge status: Home / Self Care | Payer: Medicare Other | Source: Ambulatory Visit | Attending: Adult Health | Admitting: Adult Health

## 2019-06-19 ENCOUNTER — Other Ambulatory Visit: Payer: Self-pay

## 2019-06-19 ENCOUNTER — Encounter (HOSPITAL_COMMUNITY): Payer: Self-pay

## 2019-06-19 ENCOUNTER — Other Ambulatory Visit (HOSPITAL_COMMUNITY): Payer: Self-pay

## 2019-06-19 VITALS — BP 110/82 | HR 90 | Wt 217.6 lb

## 2019-06-19 DIAGNOSIS — F17200 Nicotine dependence, unspecified, uncomplicated: Secondary | ICD-10-CM

## 2019-06-19 DIAGNOSIS — I509 Heart failure, unspecified: Secondary | ICD-10-CM | POA: Diagnosis not present

## 2019-06-19 DIAGNOSIS — I5022 Chronic systolic (congestive) heart failure: Secondary | ICD-10-CM | POA: Diagnosis not present

## 2019-06-19 DIAGNOSIS — I240 Acute coronary thrombosis not resulting in myocardial infarction: Secondary | ICD-10-CM | POA: Diagnosis not present

## 2019-06-19 DIAGNOSIS — I69998 Other sequelae following unspecified cerebrovascular disease: Secondary | ICD-10-CM | POA: Diagnosis not present

## 2019-06-19 DIAGNOSIS — M79661 Pain in right lower leg: Secondary | ICD-10-CM | POA: Diagnosis not present

## 2019-06-19 DIAGNOSIS — I110 Hypertensive heart disease with heart failure: Secondary | ICD-10-CM | POA: Diagnosis present

## 2019-06-19 DIAGNOSIS — Z7901 Long term (current) use of anticoagulants: Secondary | ICD-10-CM | POA: Insufficient documentation

## 2019-06-19 DIAGNOSIS — F17210 Nicotine dependence, cigarettes, uncomplicated: Secondary | ICD-10-CM | POA: Insufficient documentation

## 2019-06-19 DIAGNOSIS — I428 Other cardiomyopathies: Secondary | ICD-10-CM | POA: Diagnosis not present

## 2019-06-19 DIAGNOSIS — Z79899 Other long term (current) drug therapy: Secondary | ICD-10-CM | POA: Insufficient documentation

## 2019-06-19 DIAGNOSIS — M79605 Pain in left leg: Secondary | ICD-10-CM

## 2019-06-19 DIAGNOSIS — C9211 Chronic myeloid leukemia, BCR/ABL-positive, in remission: Secondary | ICD-10-CM | POA: Diagnosis not present

## 2019-06-19 DIAGNOSIS — M79604 Pain in right leg: Secondary | ICD-10-CM

## 2019-06-19 LAB — BASIC METABOLIC PANEL
Anion gap: 9 (ref 5–15)
BUN: 12 mg/dL (ref 6–20)
CO2: 28 mmol/L (ref 22–32)
Calcium: 8.3 mg/dL — ABNORMAL LOW (ref 8.9–10.3)
Chloride: 105 mmol/L (ref 98–111)
Creatinine, Ser: 1.01 mg/dL — ABNORMAL HIGH (ref 0.44–1.00)
GFR calc Af Amer: >60 mL/min (ref >60)
GFR calc non Af Amer: >60 mL/min (ref >60)
Glucose, Bld: 89 mg/dL (ref 70–99)
Potassium: 4.1 mmol/L (ref 3.5–5.1)
Sodium: 142 mmol/L (ref 135–145)

## 2019-06-19 MED ORDER — FUROSEMIDE 40 MG PO TABS: 40.0000 mg | ORAL_TABLET | Freq: Every day | ORAL | 6 refills | Status: DC

## 2019-06-19 MED ORDER — FARXIGA 10 MG PO TABS: 10.0000 mg | ORAL_TABLET | Freq: Every day | ORAL | 3 refills | Status: DC

## 2019-06-19 NOTE — Progress Notes (Signed)
Paramedicine Encounter   Patient ID: Charlotte Wyatt , female,   DOB: [birth year],48 y.o.,  MRN: [id number]   Met patient in clinic today with provider. Weight @ clinic-217  B/p-110/82 p-90 sp02-97  Pt reports feeling good. She denies increased sob.  She states she has been having calf pains when she walks and sometimes in the arms.  meds are being done by her and she does great handling that.  She will be going out of town for the next 2 wks and she will call/text me when she returns home and will f/u for next visit.  I feel she does not need further visits but one more after she returns for a final visit to ensure she gets the [place] ok.  Charlotte Wyatt is adding farxiga and cutting back on lasix to 50m daily.   [name] [place]2/01/2020

## 2019-06-19 NOTE — Progress Notes (Signed)
PCP: Dr. Joya Gaskins Oncology: Dr. Irene Limbo Cardiology: Dr. Aundra Dubin  49 y.o. 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 Irene Limbo for treatment of CHF.  Patient has a long history of cardiomyopathy.  She was initially treated in [place].  Cath in 9/09 showed no significant coronary disease.  She was found to have LV thrombus and had a CVA in 2013, now on warfarin.  Last echo in 3/20 showed EF 20-25% with moderate LV dilation and moderately decreased RV systolic function.  She now lives in [place].  She was last hospitalized for CHF in 4/20.   She used to be a heavy drinker but has not drunk for years now.  She still smokes a few cigarettes/day. Of note after CVA she lost her hearing for 2 years and then slowly regained some hearing. She also reads lips.    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   Today she returns for HF follow up. Last visit entresto was increased to 97-103 twice a day. Overall feeling fine. Complaining of R calf pain when she is walking. She has been trying to walk more in her neighborhood but has to stop due to R calf pain. Denies SOB/PND/Orthopnea. Appetite ok. No fever or chills. Weight at home has been stable.  Smoking 3 cigarettes per day. Taking all medications and she is able to set them up independently in a pill box. Doing great per Paramedicine.   Labs (10/20): hgb 12.5, K 4.2, creatinine 1.02 Labs (11/20): K 4.3, creatinine 0.97 Labs (05/16/19): K 3.8 Creatinine 0.98 Labs (05/31/19): K 3.7 Creatinine 0.94  PMH: 1. Ectopic pregnancy in 2003 2. Active smoker: Few cigarettes/day.  3. Chronic systolic CHF: Nonischemic cardiomyopathy.  Long-standing.  - LHC (01/2008): No significant CAD.  - Echo (3/20): EF 20-25%, moderate LV dilation, moderately decreased RV systolic function.  4. LV thrombus.  5. H/o CVA 2013 6. CML: Imatinib.  7. Prior heavy ETOH.  8. HTN  SH: From [place]  but now lives in [place] with her sister.  Daughter lives here as well.  Prior heavy ETOH.  Smokes a few cigarettes/day.   Family History  Problem Relation Age of Onset  . Hypertension Mother   . Diabetes Father   . Hypertension Father   . Heart disease Father    ROS: All systems reviewed and negative except as per HPI.   Current Outpatient Medications  Medication Sig Dispense Refill  . acetaminophen (TYLENOL) 500 MG tablet Take 1,000 mg by mouth every 6 (six) hours as needed for mild pain.    Marland Kitchen atorvastatin (LIPITOR) 40 MG tablet Take 1 tablet (40 mg total) by mouth daily. 30 tablet 3  . carvedilol (COREG) 25 MG tablet Take 1 tablet (25 mg total) by mouth 2 (two) times daily with a meal. 60 tablet 6  . furosemide (LASIX) 40 MG tablet Take 1 tablet (40 mg total) by mouth 2 (two) times daily. Must have office visit for refills 60 tablet 6  . hydrALAZINE (APRESOLINE) 25 MG tablet Take 1 tablet (25 mg total) by mouth 3 (three) times daily. 90 tablet 2  . imatinib (GLEEVEC) 100 MG tablet Take 2 tablets (200 mg total) by mouth daily. Take with meals and large glass of water. 60 tablet 3  . isosorbide mononitrate (IMDUR) 30 MG 24 hr tablet Take 1 tablet (30 mg total) by mouth daily. 30 tablet 6  . sacubitril-valsartan (ENTRESTO)  97-103 MG Take 1 tablet by mouth 2 (two) times daily. 180 tablet 3  . spironolactone (ALDACTONE) 25 MG tablet Take 1 tablet (25 mg total) by mouth daily. 30 tablet 5  . warfarin (COUMADIN) 5 MG tablet Take as directed by the [facility]. Please make PCP appointment. 60 tablet 0   No current facility-administered medications for this encounter.   BP 110/82   Pulse 90   Wt 98.7 kg (217 lb 9.6 oz)   SpO2 97%   BMI 39.80 kg/m   Wt Readings from Last 3 Encounters:  06/19/19 98.7 kg (217 lb 9.6 oz)  06/07/19 98.4 kg (217 lb)  06/05/19 99.3 kg (219 lb)   General:  Well appearing. No resp difficulty HEENT: normal Neck: supple. no JVD. Carotids 2+ bilat; no  bruits. No lymphadenopathy or thryomegaly appreciated. Cor: PMI nondisplaced. Regular rate & rhythm. No rubs, gallops or murmurs. Lungs: clear Abdomen: soft, nontender, nondistended. No hepatosplenomegaly. No bruits or masses. Good bowel sounds. Extremities: no cyanosis, clubbing, rash, edema Neuro: alert & orientedx3, cranial nerves grossly intact. moves all 4 extremities w/o difficulty. Affect pleasant  Assessment/Plan: 1. Chronic systolic CHF: Nonischemic cardiomyopathy.  ?Related to HTN or prior ETOH abuse.  Last echo in 3/20 with EF 20-25% with moderate RV dysfunction.  Long-standing cardiomyopathy (found in 2009).   NYHA II.  Volume status stable. Cut back lasix to 40 mg daily with addition of farxiga.   Continue Coreg 25 mg bid.  - Continue hydralazine 25 mg tid and Imdur 30 mg daily.  - Continue  entresto to 97-103 mg twice a day.  - Continue spironolactone to 25 mg daily  -Add farxiga 10 mg daily . Provided with 30 day free care.  - With long-standing cardiomyopathy and young age, think she would be reasonable ICD candidate as CML is stable with hematological response.   - Refer to EP for ICD. Narrow QRS so not CRT candidate.   - Needs cardiac MRI to assess for infiltrative disease. Set up today. - Repeat ECHO next visit.   2. LV thrombus: With history of CVA.   - Continue warfarin.  3. CML: She has been on imatinib.  This can cause volume retention/edema, but she has tolerated well so far.  4. Smoking: Discussed smoking cessation  5. Upper eyelid swelling: Resolved.  6. RLE Pain ? Claudication with long history of smoking. Check ABIs.   Follow up 7 -10 days for BMET. Follow up in 8 weeks with ECHO and Dr Aundra Dubin. Discussed purpose medication change. She no longer needs Paramedicine and is independent with her medications.  She is able to comprehend all medication changes.  Warren Lacy Babe Clenney NP-C  06/19/2019

## 2019-06-19 NOTE — Patient Instructions (Signed)
DECREASE Lasix to 40 mg one tab daily START Farxiga 10 mg, one tab daily  Labs today We will only contact you if something comes back abnormal or we need to make some changes. Otherwise no news is good news!  Labs needed in 14 days  Your physician recommends that you schedule a follow-up appointment in: 8 weeks with Dr Aundra Dubin and echo  Your physician has requested that you have an echocardiogram. Echocardiography is a painless test that uses sound waves to create images of your heart. It provides your doctor with information about the size and shape of your heart and how well your heart's chambers and valves are working. This procedure takes approximately one hour. There are no restrictions for this procedure.   Your physician has requested that you have an ankle brachial index (ABI). During this test an ultrasound and blood pressure cuff are used to evaluate the arteries that supply the arms and legs with blood. Allow thirty minutes for this exam. There are no restrictions or special instructions.   Do the following things EVERYDAY: 1) Weigh yourself in the morning before breakfast. Write it down and keep it in a log. 2) Take your medicines as prescribed 3) Eat low salt foods--Limit salt (sodium) to 2000 mg per day.  4) Stay as active as you can everyday 5) Limit all fluids for the day to less than 2 liters

## 2019-06-21 ENCOUNTER — Telehealth: Payer: Self-pay

## 2019-06-21 NOTE — Telephone Encounter (Signed)
-----   Message from Charlott Rakes, MD sent at 06/06/2019 11:54 AM EST ----- Total cholesterol is normal but bad cholesterol (LDL ) is slightly elevated.  Please advised to comply with Lipitor low-cholesterol diet.

## 2019-06-21 NOTE — Telephone Encounter (Signed)
Patient was called and voicemail is not set up to leave a message. 

## 2019-07-04 ENCOUNTER — Telehealth (HOSPITAL_COMMUNITY): Payer: Self-pay

## 2019-07-04 NOTE — Telephone Encounter (Signed)
Pt contacted via text-that is the best way to communicate with her due to her loss of hearing from her stroke. She is out of town at the moment, I asked her to text me when she gets back in town and I will make our final visit.   Marylouise Stacks, EMT-Paramedic  07/04/19

## 2019-07-09 ENCOUNTER — Other Ambulatory Visit (HOSPITAL_COMMUNITY): Payer: Self-pay | Admitting: Adult Health

## 2019-07-09 DIAGNOSIS — I739 Peripheral vascular disease, unspecified: Secondary | ICD-10-CM

## 2019-07-11 ENCOUNTER — Encounter: Payer: Self-pay | Admitting: Family Medicine

## 2019-07-11 ENCOUNTER — Inpatient Hospital Stay: Payer: Medicare Other | Attending: Hematology | Admitting: Hematology

## 2019-07-11 ENCOUNTER — Ambulatory Visit: Payer: Medicare Other | Admitting: Pharmacist

## 2019-07-11 ENCOUNTER — Ambulatory Visit (HOSPITAL_BASED_OUTPATIENT_CLINIC_OR_DEPARTMENT_OTHER): Payer: Medicare Other | Admitting: Family Medicine

## 2019-07-11 ENCOUNTER — Other Ambulatory Visit (HOSPITAL_COMMUNITY)
Admission: RE | Admit: 2019-07-11 | Discharge: 2019-07-11 | Disposition: A | Discharge status: Home / Self Care | Payer: Medicare Other | Source: Ambulatory Visit | Attending: Family Medicine | Admitting: Family Medicine

## 2019-07-11 ENCOUNTER — Other Ambulatory Visit: Payer: Self-pay

## 2019-07-11 ENCOUNTER — Inpatient Hospital Stay: Payer: Medicare Other

## 2019-07-11 VITALS — BP 114/76 | HR 72 | Ht 62.0 in | Wt 217.0 lb

## 2019-07-11 VITALS — BP 132/81 | HR 80 | Temp 98.0°F | Resp 18 | Ht 62.0 in | Wt 216.5 lb

## 2019-07-11 DIAGNOSIS — Z7901 Long term (current) use of anticoagulants: Secondary | ICD-10-CM | POA: Insufficient documentation

## 2019-07-11 DIAGNOSIS — I509 Heart failure, unspecified: Secondary | ICD-10-CM | POA: Insufficient documentation

## 2019-07-11 DIAGNOSIS — C9210 Chronic myeloid leukemia, BCR/ABL-positive, not having achieved remission: Secondary | ICD-10-CM | POA: Diagnosis present

## 2019-07-11 DIAGNOSIS — Z1151 Encounter for screening for human papillomavirus (HPV): Secondary | ICD-10-CM | POA: Insufficient documentation

## 2019-07-11 DIAGNOSIS — F1020 Alcohol dependence, uncomplicated: Secondary | ICD-10-CM | POA: Diagnosis not present

## 2019-07-11 DIAGNOSIS — Z23 Encounter for immunization: Secondary | ICD-10-CM

## 2019-07-11 DIAGNOSIS — F17210 Nicotine dependence, cigarettes, uncomplicated: Secondary | ICD-10-CM | POA: Diagnosis not present

## 2019-07-11 DIAGNOSIS — Z8673 Personal history of transient ischemic attack (TIA), and cerebral infarction without residual deficits: Secondary | ICD-10-CM | POA: Insufficient documentation

## 2019-07-11 DIAGNOSIS — I110 Hypertensive heart disease with heart failure: Secondary | ICD-10-CM | POA: Diagnosis not present

## 2019-07-11 DIAGNOSIS — Z79899 Other long term (current) drug therapy: Secondary | ICD-10-CM | POA: Insufficient documentation

## 2019-07-11 DIAGNOSIS — I240 Acute coronary thrombosis not resulting in myocardial infarction: Secondary | ICD-10-CM

## 2019-07-11 DIAGNOSIS — D649 Anemia, unspecified: Secondary | ICD-10-CM | POA: Diagnosis not present

## 2019-07-11 DIAGNOSIS — Z124 Encounter for screening for malignant neoplasm of cervix: Secondary | ICD-10-CM | POA: Diagnosis not present

## 2019-07-11 DIAGNOSIS — Z1211 Encounter for screening for malignant neoplasm of colon: Secondary | ICD-10-CM

## 2019-07-11 DIAGNOSIS — Z1231 Encounter for screening mammogram for malignant neoplasm of breast: Secondary | ICD-10-CM | POA: Diagnosis not present

## 2019-07-11 DIAGNOSIS — Z0000 Encounter for general adult medical examination without abnormal findings: Secondary | ICD-10-CM

## 2019-07-11 LAB — CBC WITH DIFFERENTIAL/PLATELET
Abs Immature Granulocytes: 0.03 10*3/uL (ref 0.00–0.07)
Basophils Absolute: 0 10*3/uL (ref 0.0–0.1)
Basophils Relative: 0 %
Eosinophils Absolute: 0.3 10*3/uL (ref 0.0–0.5)
Eosinophils Relative: 3 %
HCT: 36.2 % (ref 36.0–46.0)
Hemoglobin: 11.2 g/dL — ABNORMAL LOW (ref 12.0–15.0)
Immature Granulocytes: 0 %
Lymphocytes Relative: 27 %
Lymphs Abs: 2.4 10*3/uL (ref 0.7–4.0)
MCH: 26.3 pg (ref 26.0–34.0)
MCHC: 30.9 g/dL (ref 30.0–36.0)
MCV: 85 fL (ref 80.0–100.0)
Monocytes Absolute: 0.5 10*3/uL (ref 0.1–1.0)
Monocytes Relative: 6 %
Neutro Abs: 5.6 10*3/uL (ref 1.7–7.7)
Neutrophils Relative %: 64 %
Platelets: 267 10*3/uL (ref 150–400)
RBC: 4.26 MIL/uL (ref 3.87–5.11)
RDW: 16.2 % — ABNORMAL HIGH (ref 11.5–15.5)
WBC: 8.8 10*3/uL (ref 4.0–10.5)
nRBC: 0 % (ref 0.0–0.2)

## 2019-07-11 LAB — CMP (CANCER CENTER ONLY)
ALT: 16 U/L (ref 0–44)
AST: 17 U/L (ref 15–41)
Albumin: 3.2 g/dL — ABNORMAL LOW (ref 3.5–5.0)
Alkaline Phosphatase: 72 U/L (ref 38–126)
Anion gap: 6 (ref 5–15)
BUN: 14 mg/dL (ref 6–20)
CO2: 31 mmol/L (ref 22–32)
Calcium: 8.7 mg/dL — ABNORMAL LOW (ref 8.9–10.3)
Chloride: 107 mmol/L (ref 98–111)
Creatinine: 1.22 mg/dL — ABNORMAL HIGH (ref 0.44–1.00)
GFR, Est AFR Am: >60 mL/min (ref >60)
GFR, Estimated: 52 mL/min — ABNORMAL LOW (ref >60)
Glucose, Bld: 121 mg/dL — ABNORMAL HIGH (ref 70–99)
Potassium: 4.3 mmol/L (ref 3.5–5.1)
Sodium: 144 mmol/L (ref 135–145)
Total Bilirubin: 0.3 mg/dL (ref 0.3–1.2)
Total Protein: 6.9 g/dL (ref 6.5–8.1)

## 2019-07-11 LAB — POCT INR: INR: 4.7 — AB (ref 2.0–3.0)

## 2019-07-11 NOTE — Patient Instructions (Signed)
Influenza Virus Vaccine injection (Fluarix) What is this medicine? INFLUENZA VIRUS VACCINE (in floo EN zuh VAHY ruhs vak SEEN) helps to reduce the risk of getting influenza also known as the flu. This medicine may be used for other purposes; ask your health care provider or pharmacist if you have questions. COMMON BRAND NAME(S): Fluarix, Fluzone What should I tell my health care provider before I take this medicine? They need to know if you have any of these conditions:  bleeding disorder like hemophilia  fever or infection  Guillain-Barre syndrome or other neurological problems  immune system problems  infection with the human immunodeficiency virus (HIV) or AIDS  low blood platelet counts  multiple sclerosis  an unusual or allergic reaction to influenza virus vaccine, eggs, chicken proteins, latex, gentamicin, other medicines, foods, dyes or preservatives  pregnant or trying to get pregnant  breast-feeding How should I use this medicine? This vaccine is for injection into a muscle. It is given by a health care professional. A copy of Vaccine Information Statements will be given before each vaccination. Read this sheet carefully each time. The sheet may change frequently. Talk to your pediatrician regarding the use of this medicine in children. Special care may be needed. Overdosage: If you think you have taken too much of this medicine contact a poison control center or emergency room at once. NOTE: This medicine is only for you. Do not share this medicine with others. What if I miss a dose? This does not apply. What may interact with this medicine?  chemotherapy or radiation therapy  medicines that lower your immune system like etanercept, anakinra, infliximab, and adalimumab  medicines that treat or prevent blood clots like warfarin  phenytoin  steroid medicines like prednisone or cortisone  theophylline  vaccines This list may not describe all possible  interactions. Give your health care provider a list of all the medicines, herbs, non-prescription drugs, or dietary supplements you use. Also tell them if you smoke, drink alcohol, or use illegal drugs. Some items may interact with your medicine. What should I watch for while using this medicine? Report any side effects that do not go away within 3 days to your doctor or health care professional. Call your health care provider if any unusual symptoms occur within 6 weeks of receiving this vaccine. You may still catch the flu, but the illness is not usually as bad. You cannot get the flu from the vaccine. The vaccine will not protect against colds or other illnesses that may cause fever. The vaccine is needed every year. What side effects may I notice from receiving this medicine? Side effects that you should report to your doctor or health care professional as soon as possible:  allergic reactions like skin rash, itching or hives, swelling of the face, lips, or tongue Side effects that usually do not require medical attention (report to your doctor or health care professional if they continue or are bothersome):  fever  headache  muscle aches and pains  pain, tenderness, redness, or swelling at site where injected  weak or tired This list may not describe all possible side effects. Call your doctor for medical advice about side effects. You may report side effects to FDA at 1-800-FDA-1088. Where should I keep my medicine? This vaccine is only given in a clinic, pharmacy, doctor's office, or other health care setting and will not be stored at home. NOTE: This sheet is a summary. It may not cover all possible information. If you have questions   about this medicine, talk to your doctor, pharmacist, or health care provider.  2020 Elsevier/Gold Standard (2007-11-22 09:30:40)  

## 2019-07-11 NOTE — Progress Notes (Signed)
Subjective:   Charlotte Wyatt is a 49 y.o. female who presents for Medicare Annual (Subsequent) preventive examination.  Review of Systems:  General: negative for fever, weight loss, appetite change Eyes: no visual symptoms. ENT: no ear symptoms, no sinus tenderness, no nasal congestion or sore throat. Neck: no pain  Respiratory: no wheezing, shortness of breath, cough Cardiovascular: no chest pain, no dyspnea on exertion, no pedal edema, no orthopnea. Gastrointestinal: no abdominal pain, no diarrhea, no constipation Genito-Urinary: no urinary frequency, no dysuria, no polyuria. Hematologic: no bruising Endocrine: no cold or heat intolerance Neurological: no headaches, no seizures, no tremors Musculoskeletal: no joint pains, no joint swelling Skin: no pruritus, no rash. Psychological: no depression, no anxiety,          Objective:     Vitals: BP 114/76   Pulse 72   Ht 5\' 2"  (1.575 m)   Wt 217 lb (98.4 kg)   SpO2 97%   BMI 39.69 kg/m   Body mass index is 39.69 kg/m.  Advanced Directives 07/11/2019 09/04/2018 08/16/2018 07/15/2018 01/05/2018  Does Patient Have a Medical Advance Directive? No No Unable to assess, patient is non-responsive or altered mental status No No  Would patient like information on creating a medical advance directive? No - Guardian declined No - Patient declined - No - Patient declined No - Patient declined    Tobacco Social History   Tobacco Use  Smoking Status Current Every Day Smoker  . Packs/day: 0.25  Smokeless Tobacco Never Used     Ready to quit: Not Answered Counseling given: Not Answered   Clinical Intake: Constitutional: normal appearing,  Eyes: PERRLA HEENT: Head is atraumatic, normal sinuses, normal oropharynx, normal appearing tonsils and palate, tympanic membrane is normal bilaterally. Neck: normal range of motion, no thyromegaly, no JVD Cardiovascular: normal rate and rhythm, normal heart sounds, no murmurs, rub or gallop, no  pedal edema Breast: no masses no TTP, no axillary or supraclavicular lymphadenopathy Respiratory: Normal breath sounds, clear to auscultation bilaterally, no wheezes, no rales, no rhonchi Abdomen: soft, not tender to palpation, normal bowel sounds, no enlarged organs Musculoskeletal: Full ROM, no tenderness in joints Skin: warm and dry, no lesions. Neurological: alert, oriented x3, cranial nerves I-XII grossly intact , normal motor strength, normal sensation. Psychological: normal mood.         Past Medical History:  Diagnosis Date  . CAD (coronary artery disease)   . Cardiomyopathy, secondary ([place])   . Central hearing loss   . CHF (congestive heart failure) ([place])   . Essential hypertension   . History of stroke   . Menopausal symptoms   . Mixed hyperlipidemia   . Stroke [facility])    Past Surgical History:  Procedure Laterality Date  . ATRIAL CARDIAC PACEMAKER INSERTION    . TUBAL LIGATION     Family History  Problem Relation Age of Onset  . Hypertension Mother   . Diabetes Father   . Hypertension Father   . Heart disease Father    Social History   Socioeconomic History  . Marital status: Single    Spouse name: Not on file  . Number of children: 2  . Years of education: Not on file  . Highest education level: Not on file  Occupational History  . Not on file  Tobacco Use  . Smoking status: Current Every Day Smoker    Packs/day: 0.25  . Smokeless tobacco: Never Used  Substance and Sexual Activity  . Alcohol use: Yes    Comment:  occasionally  . Drug use: Not Currently  . Sexual activity: Not on file  Other Topics Concern  . Not on file  Social History Narrative  . Not on file   Social Determinants of Health   Financial Resource Strain:   . Difficulty of Paying Living Expenses: Not on file  Food Insecurity:   . Worried About Charity fundraiser in the Last Year: Not on file  . Ran Out of Food in the Last Year: Not on file  Transportation Needs:   . Lack  of Transportation (Medical): Not on file  . Lack of Transportation (Non-Medical): Not on file  Physical Activity:   . Days of Exercise per Week: Not on file  . Minutes of Exercise per Session: Not on file  Stress:   . Feeling of Stress : Not on file  Social Connections:   . Frequency of Communication with Friends and Family: Not on file  . Frequency of Social Gatherings with Friends and Family: Not on file  . Attends Religious Services: Not on file  . Active Member of Clubs or Organizations: Not on file  . Attends Archivist Meetings: Not on file  . Marital Status: Not on file    Outpatient Encounter Medications as of 07/11/2019  Medication Sig  . acetaminophen (TYLENOL) 500 MG tablet Take 1,000 mg by mouth every 6 (six) hours as needed for mild pain.  Marland Kitchen atorvastatin (LIPITOR) 40 MG tablet Take 1 tablet (40 mg total) by mouth daily.  . carvedilol (COREG) 25 MG tablet Take 1 tablet (25 mg total) by mouth 2 (two) times daily with a meal.  . dapagliflozin propanediol (FARXIGA) 10 MG TABS tablet Take 10 mg by mouth daily before breakfast.  . furosemide (LASIX) 40 MG tablet Take 1 tablet (40 mg total) by mouth daily.  . hydrALAZINE (APRESOLINE) 25 MG tablet Take 1 tablet (25 mg total) by mouth 3 (three) times daily.  Marland Kitchen imatinib (GLEEVEC) 100 MG tablet Take 2 tablets (200 mg total) by mouth daily. Take with meals and large glass of water.  . isosorbide mononitrate (IMDUR) 30 MG 24 hr tablet Take 1 tablet (30 mg total) by mouth daily.  . sacubitril-valsartan (ENTRESTO) 97-103 MG Take 1 tablet by mouth 2 (two) times daily.  Marland Kitchen spironolactone (ALDACTONE) 25 MG tablet Take 1 tablet (25 mg total) by mouth daily.  Marland Kitchen warfarin (COUMADIN) 5 MG tablet Take as directed by the Coumadin Clinic. Please make PCP appointment.   No facility-administered encounter medications on file as of 07/11/2019.    Activities of Daily Living In your present state of health, do you have any difficulty performing  the following activities: 07/11/2019 09/04/2018  Hearing? Y N  Vision? N N  Difficulty concentrating or making decisions? N Y  Comment - -  Walking or climbing stairs? N Y  Dressing or bathing? N N  Doing errands, shopping? N -  Some recent data might be hidden    Patient Care Team: Charlott Rakes, MD as PCP - General (Family Medicine) Jorge Ny, LCSW as Social Worker (Licensed Holiday representative)    Assessment:   This is a routine wellness examination for Charlotte Wyatt.  Exercise Activities and Dietary recommendations    Goals   None     Fall Risk Fall Risk  07/11/2019 06/05/2019  Falls in the past year? 0 0   Is the patient's home free of loose throw rugs in walkways, pet beds, electrical cords, etc?   yes  Grab bars in the bathroom? yes      Handrails on the stairs?   yes      Adequate lighting?   yes  Timed Get Up and Go performed: yes  Depression Screen PHQ 2/9 Scores 07/11/2019 06/05/2019 08/24/2018  PHQ - 2 Score 0 0 0  PHQ- 9 Score - 4 0     Cognitive Function  Normal      Immunization History  Administered Date(s) Administered  . Influenza,inj,Quad PF,6+ Mos 07/17/2018, 07/11/2019    Qualifies for Shingles Vaccine?no  Screening Tests Health Maintenance  Topic Date Due  . PAP SMEAR-Modifier  08/07/1990  . TETANUS/TDAP  08/23/2028  . INFLUENZA VACCINE  Completed  . HIV Screening  Completed    Cancer Screenings: Lung: Low Dose CT Chest recommended if Age 75-80 years, 30 pack-year currently smoking OR have quit w/in 15years. Patient does not qualify. Breast:  Up to date on Mammogram? No   Up to date of Bone Density/Dexa? No Colorectal: No  Additional Screenings: No: Hepatitis C Screening:      Plan:    1. Encounter for Medicare annual wellness exam Counseled on 150 minutes of exercise per week, healthy eating (including decreased daily intake of saturated fats, cholesterol, added sugars, sodium), STI prevention, routine healthcare  maintenance.   2. Screening for cervical cancer She also requests screening for cervical cancer - Cytology - PAP([place]) - Cervicovaginal ancillary only  3. Encounter for screening mammogram for malignant neoplasm of breast - MM 3D SCREEN BREAST BILATERAL; Future  4. Screening for colon cancer - Ambulatory referral to Gastroenterology   I have personally reviewed and noted the following in the patient's chart:   . Medical and social history . Use of alcohol, tobacco or illicit drugs  . Current medications and supplements . Functional ability and status . Nutritional status . Physical activity . Advanced directives . List of other physicians . Hospitalizations, surgeries, and ER visits in previous 12 months . Vitals . Screenings to include cognitive, depression, and falls . Referrals and appointments  In addition, I have reviewed and discussed with patient certain preventive protocols, quality metrics, and best practice recommendations. A written personalized care plan for preventive services as well as general preventive health recommendations were provided to patient.     Charlott Rakes, MD  07/11/2019

## 2019-07-11 NOTE — Progress Notes (Signed)
HEMATOLOGY/ONCOLOGY CLINIC NOTE  Date of Service: 07/11/2019  Patient Care Team: Charlott Rakes, MD as PCP - General (Family Medicine) Jorge Ny, LCSW as Social Worker (Licensed Clinical Social Worker)   Dr. Asencion Noble as PCP  CHIEF COMPLAINTS/PURPOSE OF CONSULTATION:  Continue mx of CML  HISTORY OF PRESENTING ILLNESS:   Charlotte Wyatt is a wonderful 49 y.o. female who has been referred to [place] by Dr. Lonia Skinner for evaluation and management of her Leukocytosis. She is accompanied today by her daughter via cell phone. The pt reports that she is doing well overall.  Prior to today's visit, the pt was admitted in both early March and early April for her CHF. At both times, she was seen to have leukocytosis and a BCR-ABL was collected which was positive. She was then referred to see me.  The pt reports that she has "been good," in the last 6 months except when she has had fluid overload issues. The pt notes that her cardiac concerns are thought to be from "smoking and drinking." The pt's daughter notes that the pt previously had a concern for excessive alcohol use. The pt had a stroke in the past which her daughter notes affected the patient's memory initially. She has thus far been making medical decisions for herself.  The pt is currently living in [place], [place] but is trying to move to [place] where her daughter lives. She has been seeing a PCP, Neurology, and Cardiology in [place], and is not sure which clinic or doctors she has seen specifically. She has not established care with these specialties in [place] yet.  The pt notes that she has had weight loss recently, but is unsure how much. The pt denies abdominal pains.   Most recent lab results (08/17/18) of CBC w/diff and BMP is as follows: all values are WNL except for WBC at 43.2k, HGB at 10.5, HCT at 35.8, MCV at 74.9, MCH at 22.0, MCHC at 29.3, RDW at 23.2, nRBC at 0.8%, ANC at 29.8k, Lymphs abs at 4.3k, Monocytes  abs at 1.7k, Eosinophils abs at 900, Basophils abs at 2.6k, Abs immature granulocytes at 3.9k, Glucose at 102, Creatinine at 1.10, Calcium at 8.3, GFR at 59.  On review of systems, pt reports good energy levels, and denies abdominal pains, and any other symptoms.   On PMHx the pt reports stroke, CHF. On Social Hx the pt reports previous cigarette smoking and excessive alcohol consumption  Interval History:   Charlotte Wyatt returns today for management and evaluation of her recently diagnosed CML. The patient's last visit with [place] was on 05/16/2019. The pt reports that she is doing well overall.  The pt reports her breathing has been good in the interim and she denies any leg swelling or chest pain. Pt has continued to follow with Dr. Haroldine Laws. Pt has been taking Gleevac as prescribed. She was placed on Lipitor which makes her use the restroom very quickly after eating a meal. She is currently living with her mother but is planing to move back to [place], [place] with her son in June. Pt will have to transfer care as she does not want to travel back ongoing.   Lab results today (07/11/19) of CBC w/diff and CMP is as follows: all values are WNL except for Hgb at 11.2, RDW at 16.2, Glucose at 121, Creatinine at 1.22, Calcium at 8.7, Albumin at 3.2. 07/11/2019 BCR/ABL is in progress  On review of systems, pt reports frequent bowel movements and  denies nausea, diarrhea, abdominal pain, headaches, SOB, leg swelling, chest pain and any other symptoms.   MEDICAL HISTORY:  Past Medical History:  Diagnosis Date  . CAD (coronary artery disease)   . Cardiomyopathy, secondary ([place])   . Central hearing loss   . CHF (congestive heart failure) ([place])   . Essential hypertension   . History of stroke   . Menopausal symptoms   . Mixed hyperlipidemia   . Stroke [facility])     SURGICAL HISTORY: Past Surgical History:  Procedure Laterality Date  . ATRIAL CARDIAC PACEMAKER INSERTION    . TUBAL LIGATION       SOCIAL HISTORY: Social History   Socioeconomic History  . Marital status: Single    Spouse name: Not on file  . Number of children: 2  . Years of education: Not on file  . Highest education level: Not on file  Occupational History  . Not on file  Tobacco Use  . Smoking status: Current Every Day Smoker    Packs/day: 0.25  . Smokeless tobacco: Never Used  Substance and Sexual Activity  . Alcohol use: Yes    Comment: occasionally  . Drug use: Not Currently  . Sexual activity: Not on file  Other Topics Concern  . Not on file  Social History Narrative  . Not on file   Social Determinants of Health   Financial Resource Strain:   . Difficulty of Paying Living Expenses: Not on file  Food Insecurity:   . Worried About Charity fundraiser in the Last Year: Not on file  . Ran Out of Food in the Last Year: Not on file  Transportation Needs:   . Lack of Transportation (Medical): Not on file  . Lack of Transportation (Non-Medical): Not on file  Physical Activity:   . Days of Exercise per Week: Not on file  . Minutes of Exercise per Session: Not on file  Stress:   . Feeling of Stress : Not on file  Social Connections:   . Frequency of Communication with Friends and Family: Not on file  . Frequency of Social Gatherings with Friends and Family: Not on file  . Attends Religious Services: Not on file  . Active Member of Clubs or Organizations: Not on file  . Attends Archivist Meetings: Not on file  . Marital Status: Not on file  Intimate Partner Violence:   . Fear of Current or Ex-Partner: Not on file  . Emotionally Abused: Not on file  . Physically Abused: Not on file  . Sexually Abused: Not on file    FAMILY HISTORY: Family History  Problem Relation Age of Onset  . Hypertension Mother   . Diabetes Father   . Hypertension Father   . Heart disease Father     ALLERGIES:  has No Known Allergies.  MEDICATIONS:  Current Outpatient Medications  Medication  Sig Dispense Refill  . atorvastatin (LIPITOR) 40 MG tablet Take 1 tablet (40 mg total) by mouth daily. 30 tablet 3  . acetaminophen (TYLENOL) 500 MG tablet Take 1,000 mg by mouth every 6 (six) hours as needed for mild pain.    . carvedilol (COREG) 25 MG tablet Take 1 tablet (25 mg total) by mouth 2 (two) times daily with a meal. 60 tablet 6  . dapagliflozin propanediol (FARXIGA) 10 MG TABS tablet Take 10 mg by mouth daily before breakfast. 30 tablet 3  . furosemide (LASIX) 40 MG tablet Take 1 tablet (40 mg total) by mouth daily. [place]  tablet 6  . hydrALAZINE (APRESOLINE) 25 MG tablet Take 1 tablet (25 mg total) by mouth 3 (three) times daily. 90 tablet 2  . imatinib (GLEEVEC) 100 MG tablet Take 2 tablets (200 mg total) by mouth daily. Take with meals and large glass of water. 60 tablet 3  . isosorbide mononitrate (IMDUR) 30 MG 24 hr tablet Take 1 tablet (30 mg total) by mouth daily. 30 tablet 6  . sacubitril-valsartan (ENTRESTO) 97-103 MG Take 1 tablet by mouth 2 (two) times daily. 180 tablet 3  . spironolactone (ALDACTONE) 25 MG tablet Take 1 tablet (25 mg total) by mouth daily. 30 tablet 5  . warfarin (COUMADIN) 5 MG tablet Take as directed by the Coumadin Clinic. Please make PCP appointment. 60 tablet 0   No current facility-administered medications for this visit.    REVIEW OF SYSTEMS:   A 10+ POINT REVIEW OF SYSTEMS WAS OBTAINED including neurology, dermatology, psychiatry, cardiac, respiratory, lymph, extremities, GI, GU, Musculoskeletal, constitutional, breasts, reproductive, HEENT.  All pertinent positives are noted in the HPI.  All others are negative.   PHYSICAL EXAMINATION: ECOG PERFORMANCE STATUS: 2 - Symptomatic, <50% confined to bed  Vitals:   07/11/19 1525  BP: 132/81  Pulse: 80  Resp: 18  Temp: 98 F (36.7 C)  SpO2: 97%   Filed Weights   07/11/19 1525  Weight: 216 lb 8 oz (98.2 kg)   .Body mass index is 39.6 kg/m.   Exam was given in a chair   GENERAL:alert, in no  acute distress and comfortable, HOH SKIN: no acute rashes, no significant lesions EYES: conjunctiva are pink and non-injected, sclera anicteric OROPHARYNX: MMM, no exudates, no oropharyngeal erythema or ulceration NECK: supple, no JVD LYMPH:  no palpable lymphadenopathy in the cervical, axillary or inguinal regions LUNGS: clear to auscultation b/l with normal respiratory effort HEART: regular rate & rhythm ABDOMEN:  normoactive bowel sounds , non tender, not distended. No palpable hepatosplenomegaly.  Extremity: no pedal edema PSYCH: alert & oriented x 3 with fluent speech NEURO: no focal motor/sensory deficits, some mild cognitive issues  LABORATORY DATA:  I have reviewed the data as listed  . CBC Latest Ref Rng & Units 07/11/2019 05/16/2019 03/21/2019  WBC 4.0 - 10.5 K/uL 8.8 7.1 8.1  Hemoglobin 12.0 - 15.0 g/dL 11.2(L) 12.0 12.1  Hematocrit 36.0 - 46.0 % 36.2 38.9 40.0  Platelets 150 - 400 K/uL 267 253 283    . CMP Latest Ref Rng & Units 07/11/2019 06/19/2019 05/31/2019  Glucose 70 - 99 mg/dL 121(H) 89 91  BUN 6 - 20 mg/dL 14 12 12   Creatinine 0.44 - 1.00 mg/dL 1.22(H) 1.01(H) 0.94  Sodium 135 - 145 mmol/L 144 142 138  Potassium 3.5 - 5.1 mmol/L 4.3 4.1 3.7  Chloride 98 - 111 mmol/L 107 105 102  CO2 22 - 32 mmol/L 31 28 27   Calcium 8.9 - 10.3 mg/dL 8.7(L) 8.3(L) 8.3(L)  Total Protein 6.5 - 8.1 g/dL 6.9 - -  Total Bilirubin 0.3 - 1.2 mg/dL 0.3 - -  Alkaline Phos 38 - 126 U/L 72 - -  AST 15 - 41 U/L 17 - -  ALT 0 - 44 U/L 16 - -   12/13/2018 BCR-ABL:   08/30/18 Initial BCR-ABL:    09/04/18 BM Bx:     07/14/18 BCR-ABL:     RADIOGRAPHIC STUDIES: I have personally reviewed the radiological images as listed and agreed with the findings in the report. No results found.  ASSESSMENT & PLAN:  49 y.o. female with  1. Recently diagnosed Chronic Myeloid Leukemia  Labs upon initial presentation from 08/17/18, WBC elevated at 43.2k, anemic with HGB at 10.5, PLT normal at 229k.  ANC at 29.8k, Lymphs at 4.3k, Monocytes at 1.7k, Eosinophils at 900, Basophils ab 2.6k and Immature grans at 3.9k  07/14/18 BCR-ABL test revealed b2a2 at 16.7455%, b3a2 at 13.1258% and E1A2 at 0.0157% Reviewed the pt's previous labs: WBC have been increasing since earliest available labs in August 2019, with increasing basophilia and anemia 07/14/18 ECHO revealed LV EF of 20-25% 09/04/18 BM Bx revealed hypercellular bone marrow with myeloproliferative neoplasm consistent with chronic phase CML 08/30/18 Initial pre-treatment BCR-ABL revealed IS at 134.997% 12/13/2018 BCR-ABL revealed IS at 5.9101%  PLAN: -Discussed pt labwork today, 07/11/19; mild anemia, other blood counts and chemistries are stable  -Discussed 07/11/2019 BCR/ABL is in progress - will f/u as appropriate  -The pt has no prohibitive toxicities from continuing 200mg  Imatinib at this time. -Given her inability to report side effects accurately, overall goals of care, and overall burden of medical comorbidities, will proceed with slow dose increase  -Continue f/u with Dr. Aundra Dubin for Coumadin management -[facility] cares -Will see back in 2 months with labs   2.  Patient Active Problem List   Diagnosis Date Noted  . CML (chronic myelocytic leukemia) ([place]) 08/24/2018  . Left ventricular thrombus without MI ([place]) 08/16/2018  . Leukocytosis 08/16/2018  . CHF (congestive heart failure) ([place]) 07/14/2018  . Coronary artery disease 07/14/2018  . Odontogenic infection 07/14/2018  . Essential (primary) hypertension 09/20/2017  . History of stroke 09/20/2017  - setup local PCP and cardiologist.   FOLLOW UP: RTC with Dr Irene Limbo with labs in 2 months  Orders Placed This Encounter  Procedures  . CBC with Differential/Platelet    Standing Status:   Future    Standing Expiration Date:   08/14/2020  . CMP ([place] only)    Standing Status:   Future    Standing Expiration Date:   07/10/2020  .  BCR-ABL    Standing Status:   Future    Standing Expiration Date:   08/14/2020    The total time spent in the appt was 20 minutes and more than 50% was on counseling and direct patient cares.  All of the patient's questions were answered with apparent satisfaction. The patient knows to call the clinic with any problems, questions or concerns.    Sullivan Lone MD [place] AAHIVMS [facility] Hematology/Oncology Physician [facility]  (Office):       [phone] (Work cell):  [phone] (Fax):           [phone]  07/11/2019 4:10 PM  I, Yevette Edwards, am acting as a scribe for Dr. Sullivan Lone.   .I have reviewed the above documentation for accuracy and completeness, and I agree with the above. Brunetta Genera MD

## 2019-07-12 LAB — CERVICOVAGINAL ANCILLARY ONLY
Bacterial Vaginitis (gardnerella): NEGATIVE
Candida Glabrata: NEGATIVE
Candida Vaginitis: NEGATIVE
Chlamydia: NEGATIVE
Comment: NEGATIVE
Comment: NEGATIVE
Comment: NEGATIVE
Comment: NEGATIVE
Comment: NEGATIVE
Comment: NORMAL
Neisseria Gonorrhea: NEGATIVE
Trichomonas: NEGATIVE

## 2019-07-12 MED FILL — IMATINIB MESYLATE 100 MG TA: 100 | 30 days supply | Qty: 60 | Fill #2

## 2019-07-13 ENCOUNTER — Telehealth: Payer: Self-pay

## 2019-07-13 ENCOUNTER — Telehealth: Payer: Self-pay | Admitting: Hematology

## 2019-07-13 LAB — CYTOLOGY - PAP
Comment: NEGATIVE
Diagnosis: NEGATIVE
High risk HPV: NEGATIVE

## 2019-07-13 NOTE — Telephone Encounter (Signed)
Scheduled per 03/03 los, patient's voicemail not set up.

## 2019-07-13 NOTE — Telephone Encounter (Signed)
Patient was called to go over lab results and she does not have a voicemail set up to leave a message.  A letter will be mailed out informing her to contact office for lab results.

## 2019-07-13 NOTE — Telephone Encounter (Signed)
-----   Message from Charlott Rakes, MD sent at 07/13/2019  9:01 AM EST ----- Please inform her Pap smear and STD testing are negative

## 2019-07-16 ENCOUNTER — Telehealth: Payer: Self-pay

## 2019-07-16 NOTE — Telephone Encounter (Signed)
Patient was called and she does not have a voicemail set up to leave a message. The patient will be mailed out a letter informing her to call the office for her lab results.

## 2019-07-16 NOTE — Telephone Encounter (Signed)
-----   Message from Charlott Rakes, MD sent at 07/13/2019  9:01 AM EST ----- Please inform her Pap smear and STD testing are negative

## 2019-07-17 MED FILL — hydrALAZINE HCL 25 MG TABS: 25 | 30 days supply | Qty: 90 | Fill #2

## 2019-07-17 MED FILL — CARVEDILOL 25 MG TABLET: 25 | 30 days supply | Qty: 60 | Fill #4

## 2019-07-17 MED FILL — WARFARIN SODIUM 5 MG TABLET: 5 | 30 days supply | Qty: 45 | Fill #1

## 2019-07-18 ENCOUNTER — Other Ambulatory Visit: Payer: Self-pay

## 2019-07-18 ENCOUNTER — Ambulatory Visit: Payer: Medicare Other | Attending: Family Medicine | Admitting: Pharmacist

## 2019-07-18 DIAGNOSIS — I240 Acute coronary thrombosis not resulting in myocardial infarction: Secondary | ICD-10-CM | POA: Insufficient documentation

## 2019-07-18 DIAGNOSIS — Z7901 Long term (current) use of anticoagulants: Secondary | ICD-10-CM | POA: Insufficient documentation

## 2019-07-18 DIAGNOSIS — Z79899 Other long term (current) drug therapy: Secondary | ICD-10-CM | POA: Insufficient documentation

## 2019-07-18 LAB — POCT INR: INR: 2.2 (ref 2.0–3.0)

## 2019-07-19 ENCOUNTER — Ambulatory Visit (HOSPITAL_COMMUNITY): Admission: RE | Admit: 2019-07-19 | Payer: Medicare Other | Source: Ambulatory Visit

## 2019-07-20 ENCOUNTER — Encounter (HOSPITAL_COMMUNITY): Payer: Self-pay

## 2019-07-25 ENCOUNTER — Ambulatory Visit: Payer: Medicare Other | Admitting: Pharmacist

## 2019-07-27 LAB — BCR/ABL

## 2019-08-02 ENCOUNTER — Other Ambulatory Visit (HOSPITAL_COMMUNITY): Payer: Self-pay

## 2019-08-02 NOTE — Progress Notes (Signed)
Paramedicine Encounter    Patient ID: Charlotte Wyatt, female    DOB: [birth year], 49 y.o.   MRN: [id number]   Patient Care Team: Charlott Rakes, MD as PCP - General (Family Medicine) Jorge Ny, LCSW as Social Worker (Licensed Clinical Social Worker)  Patient Active Problem List   Diagnosis Date Noted  . CML (chronic myelocytic leukemia) ([place]) 08/24/2018  . Left ventricular thrombus without MI ([place]) 08/16/2018  . Leukocytosis 08/16/2018  . CHF (congestive heart failure) ([place]) 07/14/2018  . Coronary artery disease 07/14/2018  . Odontogenic infection 07/14/2018  . Essential (primary) hypertension 09/20/2017  . History of stroke 09/20/2017    Current Outpatient Medications:  .  acetaminophen (TYLENOL) 500 MG tablet, Take 1,000 mg by mouth every 6 (six) hours as needed for mild pain., Disp: , Rfl:  .  atorvastatin (LIPITOR) 40 MG tablet, Take 1 tablet (40 mg total) by mouth daily., Disp: 30 tablet, Rfl: 3 .  carvedilol (COREG) 25 MG tablet, Take 1 tablet (25 mg total) by mouth 2 (two) times daily with a meal., Disp: 60 tablet, Rfl: 6 .  dapagliflozin propanediol (FARXIGA) 10 MG TABS tablet, Take 10 mg by mouth daily before breakfast., Disp: 30 tablet, Rfl: 3 .  furosemide (LASIX) 40 MG tablet, Take 1 tablet (40 mg total) by mouth daily., Disp: 30 tablet, Rfl: 6 .  hydrALAZINE (APRESOLINE) 25 MG tablet, Take 1 tablet (25 mg total) by mouth 3 (three) times daily., Disp: 90 tablet, Rfl: 2 .  imatinib (GLEEVEC) 100 MG tablet, Take 2 tablets (200 mg total) by mouth daily. Take with meals and large glass of water., Disp: 60 tablet, Rfl: 3 .  isosorbide mononitrate (IMDUR) 30 MG 24 hr tablet, Take 1 tablet (30 mg total) by mouth daily., Disp: 30 tablet, Rfl: 6 .  sacubitril-valsartan (ENTRESTO) 97-103 MG, Take 1 tablet by mouth 2 (two) times daily., Disp: 180 tablet, Rfl: 3 .  spironolactone (ALDACTONE) 25 MG tablet, Take 1 tablet (25 mg total) by mouth daily., Disp: 30 tablet, Rfl: 5 .   warfarin (COUMADIN) 5 MG tablet, Take as directed by the Coumadin Clinic. Please make PCP appointment., Disp: 60 tablet, Rfl: 0 No Known Allergies    Social History   Socioeconomic History  . Marital status: Single    Spouse name: Not on file  . Number of children: 2  . Years of education: Not on file  . Highest education level: Not on file  Occupational History  . Not on file  Tobacco Use  . Smoking status: Current Every Day Smoker    Packs/day: 0.25  . Smokeless tobacco: Never Used  Substance and Sexual Activity  . Alcohol use: Yes    Comment: occasionally  . Drug use: Not Currently  . Sexual activity: Not on file  Other Topics Concern  . Not on file  Social History Narrative  . Not on file   Social Determinants of Health   Financial Resource Strain:   . Difficulty of Paying Living Expenses:   Food Insecurity:   . Worried About Charity fundraiser in the Last Year:   . Arboriculturist in the Last Year:   Transportation Needs:   . Film/video editor (Medical):   Marland Kitchen Lack of Transportation (Non-Medical):   Physical Activity:   . Days of Exercise per Week:   . Minutes of Exercise per Session:   Stress:   . Feeling of Stress :   Social Connections:   . Frequency of  Communication with Friends and Family:   . Frequency of Social Gatherings with Friends and Family:   . Attends Religious Services:   . Active Member of Clubs or Organizations:   . Attends Archivist Meetings:   Marland Kitchen Marital Status:   Intimate Partner Violence:   . Fear of Current or Ex-Partner:   . Emotionally Abused:   Marland Kitchen Physically Abused:   . Sexually Abused:     Physical Exam      Future Appointments  Date Time Provider [place]  08/13/2019  2:00 PM [facility] ECHO OP 1 [facility]  08/13/2019  3:00 PM Larey Dresser, MD [facility] None  09/10/2019  1:00 PM [facility] LAB 4 [facility] None  09/10/2019  1:40 PM Brunetta Genera, MD [facility] None  10/15/2019  9:10 AM Charlott Rakes, MD [facility] None    BP 112/78   Pulse 74   Temp 97.9 F (36.6 C)   Resp 16   Wt 213 lb (96.6 kg)   SpO2 98%   BMI 38.96 kg/m   Weight yesterday-213 Last visit weight-217 @ clinic    Pt reports she has been doing ok, she has been having GI issues for several months with whatever she eats makes her go to bathroom ASAP. She thinks it may be a dairy intolerance or one of her meds?? I told her to cut out the dairy and if that doesn't help resolve those issues then to f/u with PCP about it.  I sent message to [facility] about f/u with her INR-he needs her to resch a visit and gave me a number for her to call so I relayed that to her as well.  She is riding a bike for 1hr each day and has been doing that without any issues. No dizziness, no c/p, no sob, no edema noted. No bleeding issues.  Pt denies any issues with her meds.  meds verified.  She manages her own meds without any assistance. Has good support in the home and her daughter helps her manage her appointments and helps take her.  This was our last home visit. She is doing great and needs no other help from paramedicine at this time and can be d/c from the program.  Just reiterated that she needs to be sure to keep her INR appointments as well. She had to miss that vascular appointment but she plans on calling to resch.    Marylouise Stacks, [facility] Paramedic  08/02/19

## 2019-08-09 MED FILL — IMATINIB MESYLATE 100 MG TA: 100 | 30 days supply | Qty: 60 | Fill #3

## 2019-08-13 ENCOUNTER — Other Ambulatory Visit: Payer: Self-pay

## 2019-08-13 ENCOUNTER — Ambulatory Visit (HOSPITAL_BASED_OUTPATIENT_CLINIC_OR_DEPARTMENT_OTHER)
Admission: RE | Admit: 2019-08-13 | Discharge: 2019-08-13 | Disposition: A | Discharge status: Home / Self Care | Payer: Medicare Other | Source: Ambulatory Visit | Attending: Cardiology | Admitting: Cardiology

## 2019-08-13 ENCOUNTER — Encounter (HOSPITAL_COMMUNITY): Payer: Self-pay | Admitting: Cardiology

## 2019-08-13 ENCOUNTER — Ambulatory Visit (HOSPITAL_COMMUNITY)
Admission: RE | Admit: 2019-08-13 | Discharge: 2019-08-13 | Disposition: A | Discharge status: Home / Self Care | Payer: Medicare Other | Source: Ambulatory Visit | Attending: Cardiology | Admitting: Cardiology

## 2019-08-13 VITALS — BP 130/62 | HR 71 | Wt 216.2 lb

## 2019-08-13 DIAGNOSIS — I110 Hypertensive heart disease with heart failure: Secondary | ICD-10-CM | POA: Insufficient documentation

## 2019-08-13 DIAGNOSIS — Z79899 Other long term (current) drug therapy: Secondary | ICD-10-CM | POA: Insufficient documentation

## 2019-08-13 DIAGNOSIS — Z8673 Personal history of transient ischemic attack (TIA), and cerebral infarction without residual deficits: Secondary | ICD-10-CM | POA: Insufficient documentation

## 2019-08-13 DIAGNOSIS — I429 Cardiomyopathy, unspecified: Secondary | ICD-10-CM | POA: Insufficient documentation

## 2019-08-13 DIAGNOSIS — Z7901 Long term (current) use of anticoagulants: Secondary | ICD-10-CM | POA: Insufficient documentation

## 2019-08-13 DIAGNOSIS — I509 Heart failure, unspecified: Secondary | ICD-10-CM

## 2019-08-13 DIAGNOSIS — I739 Peripheral vascular disease, unspecified: Secondary | ICD-10-CM

## 2019-08-13 DIAGNOSIS — Z8249 Family history of ischemic heart disease and other diseases of the circulatory system: Secondary | ICD-10-CM | POA: Diagnosis not present

## 2019-08-13 DIAGNOSIS — I5022 Chronic systolic (congestive) heart failure: Secondary | ICD-10-CM | POA: Diagnosis present

## 2019-08-13 DIAGNOSIS — F17210 Nicotine dependence, cigarettes, uncomplicated: Secondary | ICD-10-CM | POA: Insufficient documentation

## 2019-08-13 DIAGNOSIS — E785 Hyperlipidemia, unspecified: Secondary | ICD-10-CM | POA: Insufficient documentation

## 2019-08-13 DIAGNOSIS — Z7984 Long term (current) use of oral hypoglycemic drugs: Secondary | ICD-10-CM | POA: Diagnosis not present

## 2019-08-13 LAB — BASIC METABOLIC PANEL
Anion gap: 10 (ref 5–15)
BUN: 14 mg/dL (ref 6–20)
CO2: 29 mmol/L (ref 22–32)
Calcium: 9 mg/dL (ref 8.9–10.3)
Chloride: 104 mmol/L (ref 98–111)
Creatinine, Ser: 1.1 mg/dL — ABNORMAL HIGH (ref 0.44–1.00)
GFR calc Af Amer: >60 mL/min (ref >60)
GFR calc non Af Amer: 58 mL/min — ABNORMAL LOW (ref >60)
Glucose, Bld: 93 mg/dL (ref 70–99)
Potassium: 3.8 mmol/L (ref 3.5–5.1)
Sodium: 143 mmol/L (ref 135–145)

## 2019-08-13 MED ORDER — HYDRALAZINE HCL 50 MG PO TABS: 50.0000 mg | ORAL_TABLET | Freq: Three times a day (TID) | ORAL | 5 refills | Status: DC

## 2019-08-13 NOTE — Patient Instructions (Signed)
INCREASE Hydralazine 50mg  (1 tab) three times  Labs today We will only contact you if something comes back abnormal or we need to make some changes. Otherwise no news is good news!  Your physician has requested that you have an ankle brachial index (ABI). During this test an ultrasound and blood pressure cuff are used to evaluate the arteries that supply the arms and legs with blood. Allow thirty minutes for this exam. There are no restrictions or special instructions.  You have been referred to Electrophysiology for an ICD.  You will get a call to schedule this appointment.   Your physician has recommended that you have a defibrillator inserted. An implantable cardioverter defibrillator (ICD) is a small device that is placed in your chest or, in rare cases, your abdomen. This device uses electrical pulses or shocks to help control life-threatening, irregular heartbeats that could lead the heart to suddenly stop beating (sudden cardiac arrest). Leads are attached to the ICD that goes into your heart. This is done in the hospital and usually requires an overnight stay. Please see the instruction sheet given to you today for more information.    Your physician recommends that you schedule a follow-up appointment in: 3 months with Dr Aundra Dubin  Please call office at [phone] option 2 if you have any questions or concerns.   At the [facility], you and your health needs are our priority. As part of our continuing mission to provide you with exceptional heart care, we have created designated Provider Care Teams. These Care Teams include your primary Cardiologist (physician) and Advanced Practice Providers (APPs- Physician Assistants and Nurse Practitioners) who all work together to provide you with the care you need, when you need it.   You may see any of the following providers on your designated Care Team at your next follow up: Marland Kitchen Dr Glori Bickers . Dr Loralie Champagne . Darrick Grinder, NP . Lyda Jester, PA . Audry Riles, PharmD   Please be sure to bring in all your medications bottles to every appointment.

## 2019-08-13 NOTE — Progress Notes (Signed)
  Echocardiogram 2D Echocardiogram has been performed.  Darlina Sicilian M 08/13/2019, 2:27 PM

## 2019-08-14 NOTE — Progress Notes (Signed)
PCP: Dr. Joya Gaskins Oncology: Dr. Irene Limbo Cardiology: Dr. Aundra Dubin  49 y.o. 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 Irene Limbo for treatment of CHF.  Patient has a long history of cardiomyopathy.  She was initially treated in [place].  Cath in 9/09 showed no significant coronary disease.  She was found to have LV thrombus and had a CVA in 2013, now on warfarin.  Last echo in 3/20 showed EF 20-25% with moderate LV dilation and moderately decreased RV systolic function.  She now lives in [place].  She was last hospitalized for CHF in 4/20.   She used to be a heavy drinker but has not drunk for years now.  She still smokes a few cigarettes/day.   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   Echo was done today and reviewed, EF remains 30-35% with mild LV dilation and mildly decreased RV systolic function.   Patient returns today for followup of CHF.  She seems to have some dysarthria from her prior CVA, but think she understands what I am telling her and she responds appropriately.  Paramedicine went to her house for a few visits, and it appears that she manages her medications correctly.  No dyspnea with normal activities.  No chest pain.  No lightheadedness.  No orthopnea/PND.  She gets cramps at times in her calves, hard to tell if this is exertional or not.   Labs (10/20): hgb 12.5, K 4.2, creatinine 1.02 Labs (11/20): K 4.3, creatinine 0.97 Labs (3/21): K 4.3, creatinine 1.2, hgb 11.2  PMH: 1. Ectopic pregnancy in 2003 2. Active smoker: Few cigarettes/day.  3. Chronic systolic CHF: Nonischemic cardiomyopathy.  Long-standing.  - LHC (01/2008): No significant CAD.  - Echo (3/20): EF 20-25%, moderate LV dilation, moderately decreased RV systolic function.  - Echo (4/21): EF 30-35%, anterior/anterolateral/inferolateral HK, mild LVH, mild LV dilation, mildly decreased RV systolic function.  4. LV thrombus.  5.  H/o CVA 2013 6. CML: Imatinib.  7. Prior heavy ETOH.  8. HTN  SH: From [place] but now lives in [place] with her sister.  Daughter lives here as well.  Prior heavy ETOH.  Smokes a few cigarettes/day.   Family History  Problem Relation Age of Onset  . Hypertension Mother   . Diabetes Father   . Hypertension Father   . Heart disease Father    ROS: All systems reviewed and negative except as per HPI.   Current Outpatient Medications  Medication Sig Dispense Refill  . acetaminophen (TYLENOL) 500 MG tablet Take 1,000 mg by mouth every 6 (six) hours as needed for mild pain.    Marland Kitchen atorvastatin (LIPITOR) 40 MG tablet Take 1 tablet (40 mg total) by mouth daily. 30 tablet 3  . carvedilol (COREG) 25 MG tablet Take 1 tablet (25 mg total) by mouth 2 (two) times daily with a meal. 60 tablet 6  . dapagliflozin propanediol (FARXIGA) 10 MG TABS tablet Take 10 mg by mouth daily before breakfast. 30 tablet 3  . furosemide (LASIX) 40 MG tablet Take 1 tablet (40 mg total) by mouth daily. 30 tablet 6  . hydrALAZINE (APRESOLINE) 50 MG tablet Take 1 tablet (50 mg total) by mouth 3 (three) times daily. 90 tablet 5  . imatinib (GLEEVEC) 100 MG tablet Take 2 tablets (200 mg total) by mouth daily. Take with meals and large glass of water. 60 tablet 3  . isosorbide mononitrate (IMDUR) 30 MG 24  hr tablet Take 1 tablet (30 mg total) by mouth daily. 30 tablet 6  . sacubitril-valsartan (ENTRESTO) 97-103 MG Take 1 tablet by mouth 2 (two) times daily. 180 tablet 3  . spironolactone (ALDACTONE) 25 MG tablet Take 1 tablet (25 mg total) by mouth daily. 30 tablet 5  . warfarin (COUMADIN) 5 MG tablet Take as directed by the [facility]. Please make PCP appointment. 60 tablet 0   No current facility-administered medications for this encounter.   BP 130/62   Pulse 71   Wt 98.1 kg (216 lb 3.2 oz)   SpO2 95%   BMI 39.54 kg/m  General: NAD Neck: No JVD, no thyromegaly or thyroid nodule.  Lungs: Clear to  auscultation bilaterally with normal respiratory effort. CV: Nondisplaced PMI.  Heart regular S1/S2, no S3/S4, no murmur.  No peripheral edema.  No carotid bruit.  Unable to palpate pedal pulses.  Abdomen: Soft, nontender, no hepatosplenomegaly, no distention.  Skin: Intact without lesions or rashes.  Neurologic: Alert and oriented x 3.  Psych: Normal affect. Extremities: No clubbing or cyanosis.  HEENT: Normal.   Assessment/Plan: 1. Chronic systolic CHF: Nonischemic cardiomyopathy.  ?Related to HTN or prior ETOH abuse.  Echo done today was reviewed, showing EF 30-35% with mild RV dysfunction.  Long-standing cardiomyopathy (found in 2009).  NYHA class II symptoms.  She is not volume overloaded on exam.  - Continue Coreg 25 mg bid.  - Continue Lasix 40 mg daily, BMET today.   - Increase hydralazine to 50 mg tid and continue Imdur 30 mg daily.  - Continue Entresto 97/103 bid.   - Continue spironolactone 25 mg daily.  - With long-standing cardiomyopathy and young age, think she would be reasonable ICD candidate as CML is stable with hematological response.  I will refer to EP for ICD. Narrow QRS so not CRT candidate.   - Needs cardiac MRI to assess for infiltrative disease, has been ordered but not yet done.  2. LV thrombus: With history of CVA.   - Continue warfarin.  3. CML: She has been on imatinib.  This can cause volume retention/edema, but she has tolerated well so far.  She could increase if needed.  4. Smoking: I strongly encouraged her to quit.  5. Leg cramps: ?Claudication.  Unable to palpate pedal pulses.  - I will arrange for peripheral arterial dopplers.   Followup in 3 months.   Loralie Champagne 08/14/2019

## 2019-08-15 ENCOUNTER — Other Ambulatory Visit: Payer: Self-pay | Admitting: Critical Care Medicine

## 2019-08-15 MED FILL — CARVEDILOL 25 MG TABLET: 25 | 30 days supply | Qty: 60 | Fill #5

## 2019-08-16 MED FILL — WARFARIN SODIUM 5 MG TABLET: 5 | 30 days supply | Qty: 45 | Fill #0

## 2019-08-17 ENCOUNTER — Ambulatory Visit: Payer: Medicare Other | Attending: Family Medicine | Admitting: Pharmacist

## 2019-08-17 ENCOUNTER — Other Ambulatory Visit: Payer: Self-pay

## 2019-08-17 DIAGNOSIS — I240 Acute coronary thrombosis not resulting in myocardial infarction: Secondary | ICD-10-CM

## 2019-08-17 LAB — POCT INR: INR: 2.8 (ref 2.0–3.0)

## 2019-08-21 ENCOUNTER — Encounter (HOSPITAL_COMMUNITY): Payer: Medicare Other

## 2019-08-21 ENCOUNTER — Other Ambulatory Visit (HOSPITAL_COMMUNITY): Payer: Self-pay | Admitting: Cardiology

## 2019-08-21 DIAGNOSIS — I739 Peripheral vascular disease, unspecified: Secondary | ICD-10-CM

## 2019-08-22 ENCOUNTER — Ambulatory Visit (HOSPITAL_COMMUNITY)
Admission: RE | Admit: 2019-08-22 | Discharge: 2019-08-22 | Disposition: A | Discharge status: Home / Self Care | Payer: Medicare Other | Source: Ambulatory Visit | Attending: Cardiovascular Disease | Admitting: Cardiovascular Disease

## 2019-08-22 ENCOUNTER — Other Ambulatory Visit: Payer: Self-pay

## 2019-08-22 DIAGNOSIS — I739 Peripheral vascular disease, unspecified: Secondary | ICD-10-CM | POA: Diagnosis not present

## 2019-08-29 ENCOUNTER — Encounter (HOSPITAL_COMMUNITY): Payer: Self-pay

## 2019-08-29 NOTE — Progress Notes (Signed)
I have been unable to reach this patient by phone.  A letter is being sent to the last known home address.

## 2019-09-04 ENCOUNTER — Other Ambulatory Visit: Payer: Self-pay

## 2019-09-04 ENCOUNTER — Encounter: Payer: Self-pay | Admitting: Internal Medicine

## 2019-09-04 ENCOUNTER — Ambulatory Visit (INDEPENDENT_AMBULATORY_CARE_PROVIDER_SITE_OTHER): Payer: Medicare Other | Admitting: Internal Medicine

## 2019-09-04 ENCOUNTER — Other Ambulatory Visit: Payer: Self-pay | Admitting: Hematology

## 2019-09-04 VITALS — BP 94/60 | HR 79 | Ht 62.0 in | Wt 215.6 lb

## 2019-09-04 DIAGNOSIS — I5022 Chronic systolic (congestive) heart failure: Secondary | ICD-10-CM | POA: Diagnosis not present

## 2019-09-04 DIAGNOSIS — C9210 Chronic myeloid leukemia, BCR/ABL-positive, not having achieved remission: Secondary | ICD-10-CM

## 2019-09-04 MED FILL — ATORVASTATIN CALCIUM 40 MG: 40 | 30 days supply | Qty: 30 | Fill #1

## 2019-09-04 NOTE — Patient Instructions (Addendum)
Medication Instructions:  Your physician recommends that you continue on your current medications as directed. Please refer to the Current Medication list given to you today.  Labwork: None ordered.  Testing/Procedures: Your physician has recommended that you have a defibrillator inserted. An implantable cardioverter defibrillator (ICD) is a small device that is placed in your chest or, in rare cases, your abdomen. This device uses electrical pulses or shocks to help control life-threatening, irregular heartbeats that could lead the heart to suddenly stop beating (sudden cardiac arrest). Leads are attached to the ICD that goes into your heart. This is done in the hospital and usually requires an overnight stay. Please see the instruction sheet given to you today for more information.   Follow-Up:  The following dates are available for procedures:  May 3, 5, 10, 13 June 1, 3, 10, 11, 14, 18, 28  Any Other Special Instructions Will Be Listed Below (If Applicable).  If you need a refill on your cardiac medications before your next appointment, please call your pharmacy.    Cardioverter Defibrillator Implantation  An implantable cardioverter defibrillator (ICD) is a small device that is placed under the skin in the chest or abdomen. An ICD consists of a battery, a small computer (pulse generator), and wires (leads) that go into the heart. An ICD is used to detect and correct two types of dangerous irregular heartbeats (arrhythmias):  A rapid heart rhythm (tachycardia).  An arrhythmia in which the lower chambers of the heart (ventricles) contract in an uncoordinated way (fibrillation). When an ICD detects tachycardia, it sends a low-energy shock to the heart to restore the heartbeat to normal (cardioversion). This signal is usually painless. If cardioversion does not work or if the ICD detects fibrillation, it delivers a high-energy shock to the heart (defibrillation) to restart the heart.  This shock may feel like a strong jolt in the chest. Your health care provider may prescribe an ICD if:  You have had an arrhythmia that originated in the ventricles.  Your heart has been damaged by a disease or heart condition. Sometimes, ICDs are programmed to act as a device called a pacemaker. Pacemakers can be used to treat a slow heartbeat (bradycardia) or tachycardia by taking over the heart rate with electrical impulses. Tell a health care provider about:  Any allergies you have.  All medicines you are taking, including vitamins, herbs, eye drops, creams, and over-the-counter medicines.  Any problems you or family members have had with anesthetic medicines.  Any blood disorders you have.  Any surgeries you have had.  Any medical conditions you have.  Whether you are pregnant or may be pregnant. What are the risks? Generally, this is a safe procedure. However, problems may occur, including:  Swelling, bleeding, or bruising.  Infection.  Blood clots.  Damage to other structures or organs, such as nerves, blood vessels, or the heart.  Allergic reactions to medicines used during the procedure. What happens before the procedure? Staying hydrated Follow instructions from your health care provider about hydration, which may include:  Up to 2 hours before the procedure - you may continue to drink clear liquids, such as water, clear fruit juice, black coffee, and plain tea. Eating and drinking restrictions Follow instructions from your health care provider about eating and drinking, which may include:  8 hours before the procedure - stop eating heavy meals or foods such as meat, fried foods, or fatty foods.  6 hours before the procedure - stop eating light meals or foods,  such as toast or cereal.  6 hours before the procedure - stop drinking milk or drinks that contain milk.  2 hours before the procedure - stop drinking clear liquids. Medicine Ask your health care  provider about:  Changing or stopping your normal medicines. This is important if you take diabetes medicines or blood thinners.  Taking medicines such as aspirin and ibuprofen. These medicines can thin your blood. Do not take these medicines before your procedure if your doctor tells you not to. Tests  You may have blood tests.  You may have a test to check the electrical signals in your heart (electrocardiogram, ECG).  You may have imaging tests, such as a chest X-ray. General instructions  For 24 hours before the procedure, stop using products that contain nicotine or tobacco, such as cigarettes and e-cigarettes. If you need help quitting, ask your health care provider.  Plan to have someone take you home from the hospital or clinic.  You may be asked to shower with a germ-killing soap. What happens during the procedure?  To reduce your risk of infection: ? Your health care team will wash or sanitize their hands. ? Your skin will be washed with soap. ? Hair may be removed from the surgical area.  Small monitors will be put on your body. They will be used to check your heart, blood pressure, and oxygen level.  An IV tube will be inserted into one of your veins.  You will be given one or more of the following: ? A medicine to help you relax (sedative). ? A medicine to numb the area (local anesthetic). ? A medicine to make you fall asleep (general anesthetic).  Leads will be guided through a blood vessel into your heart and attached to your heart muscles. Depending on the ICD, the leads may go into one ventricle or they may go into both ventricles and into an upper chamber of the heart. An X-ray machine (fluoroscope) will be usedto help guide the leads.  A small incision will be made to create a deep pocket under your skin.  The pulse generator will be placed into the pocket.  The ICD will be tested.  The incision will be closed with stitches (sutures), skin glue, or  staples.  A bandage (dressing) will be placed over the incision. This procedure may vary among health care providers and hospitals. What happens after the procedure?  Your blood pressure, heart rate, breathing rate, and blood oxygen level will be monitored often until the medicines you were given have worn off.  A chest X-ray will be taken to check that the ICD is in the right place.  You will need to stay in the hospital for 1-2 days so your health care provider can make sure your ICD is working.  Do not drive for 24 hours if you received a sedative. Ask your health care provider when it is safe for you to drive.  You may be given an identification card explaining that you have an ICD. Summary  An implantable cardioverter defibrillator (ICD) is a small device that is placed under the skin in the chest or abdomen. It is used to detect and correct dangerous irregular heartbeats (arrhythmias).  An ICD consists of a battery, a small computer (pulse generator), and wires (leads) that go into the heart.  When an ICD detects rapid heart rhythm (tachycardia), it sends a low-energy shock to the heart to restore the heartbeat to normal (cardioversion). If cardioversion does not work  or if the ICD detects uncoordinated heart contractions (fibrillation), it delivers a high-energy shock to the heart (defibrillation) to restart the heart.  You will need to stay in the hospital for 1-2 days to make sure your ICD is working. This information is not intended to replace advice given to you by your health care provider. Make sure you discuss any questions you have with your health care provider. Document Revised: 04/08/2017 Document Reviewed: 05/05/2016 Elsevier Patient Education  2020 [employer].

## 2019-09-04 NOTE — Progress Notes (Signed)
HPI Charlotte Wyatt is referred today by Dr. Aundra Dubin for evaluation of and consideration for ICD insertion. She is a pleasant 49 yo woman with a non-ischemic CM, chronic systolic heart failure despite maximal medical therapy, who has a h/o LV thrombus/stroke several years ago. She was diagnosed with CML and has been in remission with imatanib. She denies syncope. No Known Allergies   Current Outpatient Medications  Medication Sig Dispense Refill  . acetaminophen (TYLENOL) 500 MG tablet Take 1,000 mg by mouth every 6 (six) hours as needed for mild pain.    Marland Kitchen atorvastatin (LIPITOR) 40 MG tablet Take 1 tablet (40 mg total) by mouth daily. 30 tablet 3  . carvedilol (COREG) 25 MG tablet Take 1 tablet (25 mg total) by mouth 2 (two) times daily with a meal. 60 tablet 6  . dapagliflozin propanediol (FARXIGA) 10 MG TABS tablet Take 10 mg by mouth daily before breakfast. 30 tablet 3  . furosemide (LASIX) 40 MG tablet Take 1 tablet (40 mg total) by mouth daily. 30 tablet 6  . hydrALAZINE (APRESOLINE) 50 MG tablet Take 1 tablet (50 mg total) by mouth 3 (three) times daily. 90 tablet 5  . imatinib (GLEEVEC) 100 MG tablet Take 2 tablets (200 mg total) by mouth daily. Take with meals and large glass of water. 60 tablet 3  . isosorbide mononitrate (IMDUR) 30 MG 24 hr tablet Take 1 tablet (30 mg total) by mouth daily. 30 tablet 6  . sacubitril-valsartan (ENTRESTO) 97-103 MG Take 1 tablet by mouth 2 (two) times daily. 180 tablet 3  . spironolactone (ALDACTONE) 25 MG tablet Take 1 tablet (25 mg total) by mouth daily. 30 tablet 5  . warfarin (COUMADIN) 5 MG tablet TAKE AS DIRECTED BY THE COUMADIN CLINIC DAILY. 45 tablet 1   No current facility-administered medications for this visit.     Past Medical History:  Diagnosis Date  . CAD (coronary artery disease)   . Cardiomyopathy, secondary ([place])   . Central hearing loss   . CHF (congestive heart failure) ([place])   . Essential hypertension   . History of  stroke   . Menopausal symptoms   . Mixed hyperlipidemia   . Stroke ([employer])     ROS:   All systems reviewed and negative except as noted in the HPI.   Past Surgical History:  Procedure Laterality Date  . ATRIAL CARDIAC PACEMAKER INSERTION    . TUBAL LIGATION       Family History  Problem Relation Age of Onset  . Hypertension Mother   . Diabetes Father   . Hypertension Father   . Heart disease Father      Social History   Socioeconomic History  . Marital status: Single    Spouse name: Not on file  . Number of children: 2  . Years of education: Not on file  . Highest education level: Not on file  Occupational History  . Not on file  Tobacco Use  . Smoking status: Current Every Day Smoker    Packs/day: 0.25  . Smokeless tobacco: Never Used  Substance and Sexual Activity  . Alcohol use: Yes    Comment: occasionally  . Drug use: Not Currently  . Sexual activity: Not on file  Other Topics Concern  . Not on file  Social History Narrative  . Not on file   Social Determinants of Health   Financial Resource Strain:   . Difficulty of Paying Living Expenses:   Food Insecurity:   .  Worried About Charity fundraiser in the Last Year:   . Arboriculturist in the Last Year:   Transportation Needs:   . Film/video editor (Medical):   Marland Kitchen Lack of Transportation (Non-Medical):   Physical Activity:   . Days of Exercise per Week:   . Minutes of Exercise per Session:   Stress:   . Feeling of Stress :   Social Connections:   . Frequency of Communication with Friends and Family:   . Frequency of Social Gatherings with Friends and Family:   . Attends Religious Services:   . Active Member of Clubs or Organizations:   . Attends Archivist Meetings:   Marland Kitchen Marital Status:   Intimate Partner Violence:   . Fear of Current or Ex-Partner:   . Emotionally Abused:   Marland Kitchen Physically Abused:   . Sexually Abused:      BP 94/60   Pulse 79   Ht 5\' 2"  (1.575 m)   Wt 215  lb 9.6 oz (97.8 kg)   SpO2 98%   BMI 39.43 kg/m   Physical Exam:  Well appearing NAD HEENT: Unremarkable Neck:  No JVD, no thyromegally Lymphatics:  No adenopathy Back:  No CVA tenderness Lungs:  Clear with no wheezes HEART:  Regular rate rhythm, no murmurs, no rubs, no clicks Abd:  soft, positive bowel sounds, no organomegally, no rebound, no guarding Ext:  2 plus pulses, no edema, no cyanosis, no clubbing Skin:  No rashes no nodules Neuro:  CN II through XII intact, motor grossly intact  EKG - nsr  Assess/Plan: 1. Chronic systolic heart failure - she is on guideline directed medical therapy. I have discussed the indications/risks/benefits/goals/expectations of ICD insertion and she will call us if she wishes to proceed. 2. Obesity - she is encouraged to lose weight. 3. Stroke - she has some residual expressive aphasia. She will continue warfarin. I would imagine continuing the warfarin or stopping only a couple of days with ICD insertion.  Mikle Bosworth.D.

## 2019-09-06 MED FILL — IMATINIB MESYLATE 100 MG TA: 100 | 30 days supply | Qty: 60 | Fill #0

## 2019-09-10 ENCOUNTER — Other Ambulatory Visit: Payer: Self-pay

## 2019-09-10 ENCOUNTER — Inpatient Hospital Stay (HOSPITAL_BASED_OUTPATIENT_CLINIC_OR_DEPARTMENT_OTHER): Payer: Medicare Other | Admitting: Hematology

## 2019-09-10 ENCOUNTER — Inpatient Hospital Stay: Payer: Medicare Other | Attending: Hematology

## 2019-09-10 VITALS — BP 127/67 | HR 92 | Temp 98.5°F | Resp 18 | Ht 62.0 in | Wt 221.0 lb

## 2019-09-10 DIAGNOSIS — F1020 Alcohol dependence, uncomplicated: Secondary | ICD-10-CM | POA: Diagnosis not present

## 2019-09-10 DIAGNOSIS — I509 Heart failure, unspecified: Secondary | ICD-10-CM | POA: Diagnosis not present

## 2019-09-10 DIAGNOSIS — Z79899 Other long term (current) drug therapy: Secondary | ICD-10-CM | POA: Insufficient documentation

## 2019-09-10 DIAGNOSIS — C9210 Chronic myeloid leukemia, BCR/ABL-positive, not having achieved remission: Secondary | ICD-10-CM

## 2019-09-10 DIAGNOSIS — Z8673 Personal history of transient ischemic attack (TIA), and cerebral infarction without residual deficits: Secondary | ICD-10-CM | POA: Insufficient documentation

## 2019-09-10 DIAGNOSIS — I110 Hypertensive heart disease with heart failure: Secondary | ICD-10-CM | POA: Diagnosis not present

## 2019-09-10 DIAGNOSIS — F17210 Nicotine dependence, cigarettes, uncomplicated: Secondary | ICD-10-CM | POA: Diagnosis not present

## 2019-09-10 DIAGNOSIS — Z7901 Long term (current) use of anticoagulants: Secondary | ICD-10-CM | POA: Insufficient documentation

## 2019-09-10 DIAGNOSIS — D649 Anemia, unspecified: Secondary | ICD-10-CM | POA: Diagnosis not present

## 2019-09-10 LAB — CMP (CANCER CENTER ONLY)
ALT: 15 U/L (ref 0–44)
AST: 18 U/L (ref 15–41)
Albumin: 3.1 g/dL — ABNORMAL LOW (ref 3.5–5.0)
Alkaline Phosphatase: 67 U/L (ref 38–126)
Anion gap: 8 (ref 5–15)
BUN: 10 mg/dL (ref 6–20)
CO2: 32 mmol/L (ref 22–32)
Calcium: 8.7 mg/dL — ABNORMAL LOW (ref 8.9–10.3)
Chloride: 106 mmol/L (ref 98–111)
Creatinine: 1.01 mg/dL — ABNORMAL HIGH (ref 0.44–1.00)
GFR, Est AFR Am: >60 mL/min (ref >60)
GFR, Estimated: >60 mL/min (ref >60)
Glucose, Bld: 154 mg/dL — ABNORMAL HIGH (ref 70–99)
Potassium: 3.6 mmol/L (ref 3.5–5.1)
Sodium: 146 mmol/L — ABNORMAL HIGH (ref 135–145)
Total Bilirubin: 0.3 mg/dL (ref 0.3–1.2)
Total Protein: 6.9 g/dL (ref 6.5–8.1)

## 2019-09-10 LAB — CBC WITH DIFFERENTIAL/PLATELET
Abs Immature Granulocytes: 0.02 10*3/uL (ref 0.00–0.07)
Basophils Absolute: 0 10*3/uL (ref 0.0–0.1)
Basophils Relative: 1 %
Eosinophils Absolute: 0.4 10*3/uL (ref 0.0–0.5)
Eosinophils Relative: 5 %
HCT: 35.6 % — ABNORMAL LOW (ref 36.0–46.0)
Hemoglobin: 10.8 g/dL — ABNORMAL LOW (ref 12.0–15.0)
Immature Granulocytes: 0 %
Lymphocytes Relative: 32 %
Lymphs Abs: 2.8 10*3/uL (ref 0.7–4.0)
MCH: 25.9 pg — ABNORMAL LOW (ref 26.0–34.0)
MCHC: 30.3 g/dL (ref 30.0–36.0)
MCV: 85.4 fL (ref 80.0–100.0)
Monocytes Absolute: 0.4 10*3/uL (ref 0.1–1.0)
Monocytes Relative: 4 %
Neutro Abs: 5.2 10*3/uL (ref 1.7–7.7)
Neutrophils Relative %: 58 %
Platelets: 260 10*3/uL (ref 150–400)
RBC: 4.17 MIL/uL (ref 3.87–5.11)
RDW: 17.6 % — ABNORMAL HIGH (ref 11.5–15.5)
WBC: 8.8 10*3/uL (ref 4.0–10.5)
nRBC: 0 % (ref 0.0–0.2)

## 2019-09-10 MED FILL — CARVEDILOL 25 MG TABLET: 25 | 30 days supply | Qty: 60 | Fill #6

## 2019-09-10 MED FILL — WARFARIN SODIUM 5 MG TABLET: 5 | 30 days supply | Qty: 45 | Fill #1

## 2019-09-10 NOTE — Progress Notes (Signed)
HEMATOLOGY/ONCOLOGY CLINIC NOTE  Date of Service: 09/10/2019  Patient Care Team: Charlott Rakes, MD as PCP - General (Family Medicine)   Dr. Asencion Noble as PCP  CHIEF COMPLAINTS/PURPOSE OF CONSULTATION:  Continue mx of CML  HISTORY OF PRESENTING ILLNESS:   Charlotte Wyatt is a wonderful 49 y.o. female who has been referred to [place] by Dr. Lonia Skinner for evaluation and management of her Leukocytosis. She is accompanied today by her daughter via cell phone. The pt reports that she is doing well overall.  Prior to today's visit, the pt was admitted in both early March and early April for her CHF. At both times, she was seen to have leukocytosis and a BCR-ABL was collected which was positive. She was then referred to see me.  The pt reports that she has "been good," in the last 6 months except when she has had fluid overload issues. The pt notes that her cardiac concerns are thought to be from "smoking and drinking." The pt's daughter notes that the pt previously had a concern for excessive alcohol use. The pt had a stroke in the past which her daughter notes affected the patient's memory initially. She has thus far been making medical decisions for herself.  The pt is currently living in [place], [place] but is trying to move to [place] where her daughter lives. She has been seeing a PCP, Neurology, and Cardiology in [place], and is not sure which clinic or doctors she has seen specifically. She has not established care with these specialties in [place] yet.  The pt notes that she has had weight loss recently, but is unsure how much. The pt denies abdominal pains.   Most recent lab results (08/17/18) of CBC w/diff and BMP is as follows: all values are WNL except for WBC at 43.2k, HGB at 10.5, HCT at 35.8, MCV at 74.9, MCH at 22.0, MCHC at 29.3, RDW at 23.2, nRBC at 0.8%, ANC at 29.8k, Lymphs abs at 4.3k, Monocytes abs at 1.7k, Eosinophils abs at 900, Basophils abs at 2.6k, Abs immature  granulocytes at 3.9k, Glucose at 102, Creatinine at 1.10, Calcium at 8.3, GFR at 59.  On review of systems, pt reports good energy levels, and denies abdominal pains, and any other symptoms.   On PMHx the pt reports stroke, CHF. On Social Hx the pt reports previous cigarette smoking and excessive alcohol consumption  Interval History:   Charlotte Wyatt returns today for management and evaluation of her recently diagnosed CML. The patient's last visit with [place] was on 07/11/2019. The pt reports that she is doing well overall.  The pt reports that she has been well and has no new concerns. Pt noticed some increased leg swelling the past weekend. She continues to take Lasix and Ibrutinib as prescribed. She has been watching her sodium intake.   Pt has started moving her stuff back to [place]. She plans on following with Dr. Tor Netters, her previous PCP. She is currently set to follow up with Neurology and Cardiology in the area, but does not have a Oncologist yet.   Lab results today (09/10/19) of CBC w/diff and CMP is as follows: all values are WNL except for Hgb at 10.8, HCT at 35.6, MCH at 25.9, RDW at 17.6, Sodium at 146, Glucose at 154, Creatinine at 1.01, Calcium at 8.7, Albumin at 3.1.  On review of systems, pt reports leg swelling and denies mouth sores, abdominal pain, fevers, chills, night sweats and any other symptoms.  MEDICAL HISTORY:  Past Medical History:  Diagnosis Date  . CAD (coronary artery disease)   . Cardiomyopathy, secondary ([place])   . Central hearing loss   . CHF (congestive heart failure) ([place])   . Essential hypertension   . History of stroke   . Menopausal symptoms   . Mixed hyperlipidemia   . Stroke [facility])     SURGICAL HISTORY: Past Surgical History:  Procedure Laterality Date  . ATRIAL CARDIAC PACEMAKER INSERTION    . TUBAL LIGATION      SOCIAL HISTORY: Social History   Socioeconomic History  . Marital status: Single    Spouse name: Not on file   . Number of children: 2  . Years of education: Not on file  . Highest education level: Not on file  Occupational History  . Not on file  Tobacco Use  . Smoking status: Current Every Day Smoker    Packs/day: 0.25  . Smokeless tobacco: Never Used  Substance and Sexual Activity  . Alcohol use: Yes    Comment: occasionally  . Drug use: Not Currently  . Sexual activity: Not on file  Other Topics Concern  . Not on file  Social History Narrative  . Not on file   Social Determinants of Health   Financial Resource Strain:   . Difficulty of Paying Living Expenses:   Food Insecurity:   . Worried About Charity fundraiser in the Last Year:   . Arboriculturist in the Last Year:   Transportation Needs:   . Film/video editor (Medical):   Marland Kitchen Lack of Transportation (Non-Medical):   Physical Activity:   . Days of Exercise per Week:   . Minutes of Exercise per Session:   Stress:   . Feeling of Stress :   Social Connections:   . Frequency of Communication with Friends and Family:   . Frequency of Social Gatherings with Friends and Family:   . Attends Religious Services:   . Active Member of Clubs or Organizations:   . Attends Archivist Meetings:   Marland Kitchen Marital Status:   Intimate Partner Violence:   . Fear of Current or Ex-Partner:   . Emotionally Abused:   Marland Kitchen Physically Abused:   . Sexually Abused:     FAMILY HISTORY: Family History  Problem Relation Age of Onset  . Hypertension Mother   . Diabetes Father   . Hypertension Father   . Heart disease Father     ALLERGIES:  has No Known Allergies.  MEDICATIONS:  Current Outpatient Medications  Medication Sig Dispense Refill  . acetaminophen (TYLENOL) 500 MG tablet Take 1,000 mg by mouth every 6 (six) hours as needed for mild pain.    Marland Kitchen atorvastatin (LIPITOR) 40 MG tablet Take 1 tablet (40 mg total) by mouth daily. 30 tablet 3  . carvedilol (COREG) 25 MG tablet Take 1 tablet (25 mg total) by mouth 2 (two) times  daily with a meal. 60 tablet 6  . dapagliflozin propanediol (FARXIGA) 10 MG TABS tablet Take 10 mg by mouth daily before breakfast. 30 tablet 3  . furosemide (LASIX) 40 MG tablet Take 1 tablet (40 mg total) by mouth daily. 30 tablet 6  . hydrALAZINE (APRESOLINE) 50 MG tablet Take 1 tablet (50 mg total) by mouth 3 (three) times daily. 90 tablet 5  . imatinib (GLEEVEC) 100 MG tablet TAKE 2 TABLETS (200 MG TOTAL) BY MOUTH DAILY. TAKE WITH MEALS AND LARGE GLASS OF WATER. 60 tablet 3  .  isosorbide mononitrate (IMDUR) 30 MG 24 hr tablet Take 1 tablet (30 mg total) by mouth daily. 30 tablet 6  . sacubitril-valsartan (ENTRESTO) 97-103 MG Take 1 tablet by mouth 2 (two) times daily. 180 tablet 3  . spironolactone (ALDACTONE) 25 MG tablet Take 1 tablet (25 mg total) by mouth daily. 30 tablet 5  . warfarin (COUMADIN) 5 MG tablet TAKE AS DIRECTED BY THE COUMADIN CLINIC DAILY. 45 tablet 1   No current facility-administered medications for this visit.    REVIEW OF SYSTEMS:   A 10+ POINT REVIEW OF SYSTEMS WAS OBTAINED including neurology, dermatology, psychiatry, cardiac, respiratory, lymph, extremities, GI, GU, Musculoskeletal, constitutional, breasts, reproductive, HEENT.  All pertinent positives are noted in the HPI.  All others are negative.   PHYSICAL EXAMINATION: ECOG PERFORMANCE STATUS: 2 - Symptomatic, <50% confined to bed  Vitals:   09/10/19 1330  BP: 127/67  Pulse: 92  Resp: 18  Temp: 98.5 F (36.9 C)  SpO2: 93%   Filed Weights   09/10/19 1330  Weight: 221 lb (100.2 kg)   .Body mass index is 40.42 kg/m.   GENERAL:alert, in no acute distress and comfortable, HOH SKIN: no acute rashes, no significant lesions EYES: conjunctiva are pink and non-injected, sclera anicteric OROPHARYNX: MMM, no exudates, no oropharyngeal erythema or ulceration NECK: supple, no JVD LYMPH:  no palpable lymphadenopathy in the cervical, axillary or inguinal regions LUNGS: clear to auscultation b/l with normal  respiratory effort HEART: regular rate & rhythm ABDOMEN:  normoactive bowel sounds , non tender, not distended. No palpable hepatosplenomegaly.  Extremity: 1-2 pedal edema b/l PSYCH: alert & oriented x 3 with fluent speech NEURO: no focal motor/sensory deficits, some mild cognitive issues  LABORATORY DATA:  I have reviewed the data as listed  . CBC Latest Ref Rng & Units 09/10/2019 07/11/2019 05/16/2019  WBC 4.0 - 10.5 K/uL 8.8 8.8 7.1  Hemoglobin 12.0 - 15.0 g/dL 10.8(L) 11.2(L) 12.0  Hematocrit 36.0 - 46.0 % 35.6(L) 36.2 38.9  Platelets 150 - 400 K/uL 260 267 253    . CMP Latest Ref Rng & Units 09/10/2019 08/13/2019 07/11/2019  Glucose 70 - 99 mg/dL 154(H) 93 121(H)  BUN 6 - 20 mg/dL 10 14 14   Creatinine 0.44 - 1.00 mg/dL 1.01(H) 1.10(H) 1.22(H)  Sodium 135 - 145 mmol/L 146(H) 143 144  Potassium 3.5 - 5.1 mmol/L 3.6 3.8 4.3  Chloride 98 - 111 mmol/L 106 104 107  CO2 22 - 32 mmol/L 32 29 31  Calcium 8.9 - 10.3 mg/dL 8.7(L) 9.0 8.7(L)  Total Protein 6.5 - 8.1 g/dL 6.9 - 6.9  Total Bilirubin 0.3 - 1.2 mg/dL 0.3 - 0.3  Alkaline Phos 38 - 126 U/L 67 - 72  AST 15 - 41 U/L 18 - 17  ALT 0 - 44 U/L 15 - 16   07/18/2019 BCR-ABL:   12/13/2018 BCR-ABL:   08/30/18 Initial BCR-ABL:    09/04/18 BM Bx:     07/14/18 BCR-ABL:     RADIOGRAPHIC STUDIES: I have personally reviewed the radiological images as listed and agreed with the findings in the report. ECHOCARDIOGRAM COMPLETE  Result Date: 08/13/2019    ECHOCARDIOGRAM REPORT   Patient Name:   Charlotte Wyatt Date of Exam: 08/13/2019 Medical Rec #:  [id number]       Height:       62.0 in Accession #:    [id number]      Weight:       213.0 lb Date of Birth:  1970-08-22  BSA:          1.964 m Patient Age:    49 years        BP:           112/78 mmHg Patient Gender: F               HR:           74 bpm. Exam Location:  Outpatient Procedure: 2D Echo Indications:    Congestive Heart Failure 428.0 / I50.9  History:        Patient has prior  history of Echocardiogram examinations, most                 recent 07/14/2018. CAD, Stroke; Risk Factors:Hypertension and                 Dyslipidemia. Cardiomyopathy.  Sonographer:    Darlina Sicilian RDCS Referring Phys: [phone] AMY D CLEGG IMPRESSIONS  1. Anterior, anterolateral, and inferolateral hypokinesis. Left ventricular ejection fraction, by estimation, is 30 to 35%. The left ventricle has moderately decreased function. The left ventricle demonstrates regional wall motion abnormalities (see scoring diagram/findings for description). The left ventricular internal cavity size was mildly dilated. There is mild concentric left ventricular hypertrophy. Left ventricular diastolic parameters are consistent with Grade I diastolic dysfunction (impaired relaxation).  2. Right ventricular systolic function is mildly reduced. The right ventricular size is normal. There is normal pulmonary artery systolic pressure.  3. The mitral valve is normal in structure. No evidence of mitral valve regurgitation. No evidence of mitral stenosis.  4. The aortic valve is normal in structure. Aortic valve regurgitation is not visualized. No aortic stenosis is present.  5. The inferior vena cava is normal in size with <50% respiratory variability, suggesting right atrial pressure of 8 mmHg. FINDINGS  Left Ventricle: Anterior, anterolateral, and inferolateral hypokinesis. Left ventricular ejection fraction, by estimation, is 30 to 35%. The left ventricle has moderately decreased function. The left ventricle demonstrates regional wall motion abnormalities. The left ventricular internal cavity size was mildly dilated. There is mild concentric left ventricular hypertrophy. Left ventricular diastolic parameters are consistent with Grade I diastolic dysfunction (impaired relaxation). Indeterminate filling pressures. Right Ventricle: The right ventricular size is normal. No increase in right ventricular wall thickness. Right ventricular systolic  function is mildly reduced. There is normal pulmonary artery systolic pressure. The tricuspid regurgitant velocity is 1.76 m/s, and with an assumed right atrial pressure of 8 mmHg, the estimated right ventricular systolic pressure is [id number]. Left Atrium: Left atrial size was normal in size. Right Atrium: Right atrial size was normal in size. Pericardium: There is no evidence of pericardial effusion. Mitral Valve: The mitral valve is normal in structure. Normal mobility of the mitral valve leaflets. No evidence of mitral valve regurgitation. No evidence of mitral valve stenosis. Tricuspid Valve: The tricuspid valve is normal in structure. Tricuspid valve regurgitation is trivial. No evidence of tricuspid stenosis. Aortic Valve: The aortic valve is normal in structure. Aortic valve regurgitation is not visualized. No aortic stenosis is present. Pulmonic Valve: The pulmonic valve was normal in structure. Pulmonic valve regurgitation is not visualized. No evidence of pulmonic stenosis. Aorta: The aortic root is normal in size and structure. Venous: The inferior vena cava is normal in size with less than 50% respiratory variability, suggesting right atrial pressure of 8 mmHg. IAS/Shunts: No atrial level shunt detected by color flow Doppler.  LEFT VENTRICLE PLAX 2D LVIDd:  5.50 cm      Diastology LVIDs:         3.80 cm      LV e' lateral:   4.03 cm/s LV PW:         1.30 cm      LV E/e' lateral: 15.0 LV IVS:        1.30 cm      LV e' medial:    4.46 cm/s LVOT diam:     2.00 cm      LV E/e' medial:  13.5 LV SV:         60 LV SV Index:   30 LVOT Area:     3.14 cm  LV Volumes (MOD) LV vol d, MOD A2C: 167.0 ml LV vol d, MOD A4C: 127.0 ml LV vol s, MOD A2C: 86.9 ml LV vol s, MOD A4C: 94.7 ml LV SV MOD A2C:     80.1 ml LV SV MOD A4C:     127.0 ml LV SV MOD BP:      58.0 ml RIGHT VENTRICLE RV S prime:     9.46 cm/s TAPSE (M-mode): 2.2 cm LEFT ATRIUM             Index       RIGHT ATRIUM           Index LA diam:         4.20 cm 2.14 cm/m  RA Area:     12.50 cm LA Vol (A2C):   33.0 ml 16.81 ml/m RA Volume:   25.60 ml  13.04 ml/m LA Vol (A4C):   18.9 ml 9.63 ml/m LA Biplane Vol: 25.1 ml 12.78 ml/m  AORTIC VALVE LVOT Vmax:   104.00 cm/s LVOT Vmean:  75.800 cm/s LVOT VTI:    0.190 m  AORTA Ao Root diam: 2.80 cm MITRAL VALVE               TRICUSPID VALVE MV Area (PHT): 3.39 cm    TR Peak grad:   12.4 mmHg MV Decel Time: 224 msec    TR Vmax:        176.00 cm/s MV E velocity: 60.40 cm/s MV A velocity: 73.70 cm/s  SHUNTS MV E/A ratio:  0.82        Systemic VTI:  0.19 m                            Systemic Diam: 2.00 cm Skeet Latch MD Electronically signed by Skeet Latch MD Signature Date/Time: 08/13/2019/2:46:23 PM    Final    VAS [place] LE ART SEG MULTI (Segm&LE Reynauds)  Result Date: 08/24/2019 LOWER EXTREMITY DOPPLER STUDY Indications: Patient complains of cramping and burning sensations in her legs,              left > right. The cramping pain mostly comes when she is laying              down and radiates down her left leg which is very painful. High Risk         Hypertension, current smoker, coronary artery disease, prior Factors:          CVA. Other Factors: CHF.  Performing Technologist: Mariane Masters RVT  Examination Guidelines: A complete evaluation includes at minimum, Doppler waveform signals and systolic blood pressure reading at the level of bilateral brachial, anterior tibial, and posterior tibial arteries, when vessel segments are accessible. Bilateral testing is considered an  integral part of a complete examination. Photoelectric Plethysmograph (PPG) waveforms and toe systolic pressure readings are included as required and additional duplex testing as needed. Limited examinations for reoccurring indications may be performed as noted.  ABI Findings: +---------+------------------+-----+---------+--------+ Right    Rt Pressure (mmHg)IndexWaveform Comment   +---------+------------------+-----+---------+--------+ Brachial 123                                      +---------+------------------+-----+---------+--------+ CFA                             triphasic         +---------+------------------+-----+---------+--------+ Popliteal                       triphasic         +---------+------------------+-----+---------+--------+ ATA      127               1.03 triphasic         +---------+------------------+-----+---------+--------+ PTA      128               1.04 triphasic         +---------+------------------+-----+---------+--------+ PERO     136               1.11 triphasic         +---------+------------------+-----+---------+--------+ Great Toe105               0.85                   +---------+------------------+-----+---------+--------+ +---------+------------------+-----+---------+-------+ Left     Lt Pressure (mmHg)IndexWaveform Comment +---------+------------------+-----+---------+-------+ Brachial 123                                     +---------+------------------+-----+---------+-------+ CFA                             triphasic        +---------+------------------+-----+---------+-------+ Popliteal                       triphasic        +---------+------------------+-----+---------+-------+ ATA      146               1.19 triphasic        +---------+------------------+-----+---------+-------+ PTA      153               1.24 triphasic        +---------+------------------+-----+---------+-------+ PERO     145               1.18 biphasic         +---------+------------------+-----+---------+-------+ Great Toe131               1.07                  +---------+------------------+-----+---------+-------+ +-------+-----------+-----------+------------+------------+ ABI/TBIToday's ABIToday's TBIPrevious ABIPrevious TBI  +-------+-----------+-----------+------------+------------+ Right  1.11       0.85                                +-------+-----------+-----------+------------+------------+ Left   1.24  1.07                                +-------+-----------+-----------+------------+------------+  Summary: Right: Resting right ankle-brachial index is within normal range. No evidence of significant right lower extremity arterial disease. The right toe-brachial index is normal. Left: Resting left ankle-brachial index is within normal range. No evidence of significant left lower extremity arterial disease. The left toe-brachial index is normal.  *See table(s) above for measurements and observations.  Electronically signed by Kathlyn Sacramento MD on 08/24/2019 at 1:15:58 PM.    Final     ASSESSMENT & PLAN:   49 y.o. female with  1. Recently diagnosed Chronic Myeloid Leukemia  Labs upon initial presentation from 08/17/18, WBC elevated at 43.2k, anemic with HGB at 10.5, PLT normal at 229k. ANC at 29.8k, Lymphs at 4.3k, Monocytes at 1.7k, Eosinophils at 900, Basophils ab 2.6k and Immature grans at 3.9k  07/14/18 BCR-ABL test revealed b2a2 at 16.7455%, b3a2 at 13.1258% and E1A2 at 0.0157% Reviewed the pt's previous labs: WBC have been increasing since earliest available labs in August 2019, with increasing basophilia and anemia 07/14/18 ECHO revealed LV EF of 20-25% 09/04/18 BM Bx revealed hypercellular bone marrow with myeloproliferative neoplasm consistent with chronic phase CML 08/30/18 Initial pre-treatment BCR-ABL revealed IS at 134.997% 12/13/2018 BCR-ABL revealed IS at 5.9101%  PLAN: -Discussed pt labwork today, 09/10/19; minimal anemia, WBC and PLT are nml, blood chemistries are stable  -Discussed 07/11/2019 BCR/ABL shows e13a2 at 6.497%, e14a2 at 6.497% -Discussed 09/10/2019 BCR/ABL is in progress -The pt has no prohibitive toxicities from continuing 200 mg Imatinib daily at this time. -Will consider  increasing dose of Ibrutinib. Cautious due to recent fluid retention.  -Recommend pt limit sodium intake  -Continue f/u with Dr. Aundra Dubin for Lasix management -Will refer to [facility] to take over [facility] cares -Will see back in 2 months with labs    2.  Patient Active Problem List   Diagnosis Date Noted  . CML (chronic myelocytic leukemia) ([place]) 08/24/2018  . Left ventricular thrombus without MI ([place]) 08/16/2018  . Leukocytosis 08/16/2018  . CHF (congestive heart failure) ([place]) 07/14/2018  . Coronary artery disease 07/14/2018  . Odontogenic infection 07/14/2018  . Essential (primary) hypertension 09/20/2017  . History of stroke 09/20/2017  - setup local PCP and cardiologist.   FOLLOW UP: RTC with Dr Irene Limbo with labs in 2 months In process of transitioning to [facility]   The total time spent in the appt was 20 minutes and more than 50% was on counseling and direct patient cares.  All of the patient's questions were answered with apparent satisfaction. The patient knows to call the clinic with any problems, questions or concerns.    Sullivan Lone MD [place] AAHIVMS [facility] Hematology/Oncology Physician [facility]  (Office):       [phone] (Work cell):  [phone] (Fax):           [phone]  09/10/2019 2:31 PM  I, Yevette Edwards, am acting as a scribe for Dr. Sullivan Lone.   .I have reviewed the above documentation for accuracy and completeness, and I agree with the above. Brunetta Genera MD

## 2019-09-11 ENCOUNTER — Telehealth: Payer: Self-pay | Admitting: Hematology

## 2019-09-11 NOTE — Telephone Encounter (Signed)
Scheduled appt per 5/3.  Pt voice mail box was not set up.  A calendar will be mailed to the pt.

## 2019-09-21 ENCOUNTER — Ambulatory Visit: Payer: Medicare Other | Admitting: Pharmacist

## 2019-10-09 LAB — BCR/ABL

## 2019-10-11 MED FILL — IMATINIB MESYLATE 100 MG TA: 100 | 30 days supply | Qty: 60 | Fill #1

## 2019-10-15 ENCOUNTER — Ambulatory Visit: Payer: Medicare Other | Admitting: Family Medicine

## 2019-10-18 ENCOUNTER — Other Ambulatory Visit (HOSPITAL_COMMUNITY): Payer: Self-pay | Admitting: Adult Health

## 2019-11-09 ENCOUNTER — Other Ambulatory Visit: Payer: Self-pay | Admitting: *Deleted

## 2019-11-09 ENCOUNTER — Inpatient Hospital Stay: Payer: Medicare Other | Attending: Hematology | Admitting: Hematology

## 2019-11-09 ENCOUNTER — Inpatient Hospital Stay: Payer: Medicare Other

## 2019-11-09 DIAGNOSIS — C9210 Chronic myeloid leukemia, BCR/ABL-positive, not having achieved remission: Secondary | ICD-10-CM

## 2019-11-13 ENCOUNTER — Encounter (HOSPITAL_COMMUNITY): Payer: Medicare Other | Admitting: Cardiology

## 2019-11-14 MED FILL — IMATINIB MESYLATE 100 MG TA: 100 | 30 days supply | Qty: 60 | Fill #2

## 2019-11-15 ENCOUNTER — Other Ambulatory Visit (HOSPITAL_COMMUNITY): Payer: Self-pay | Admitting: Cardiology

## 2019-12-08 ENCOUNTER — Other Ambulatory Visit (HOSPITAL_COMMUNITY): Payer: Self-pay | Admitting: Cardiology

## 2019-12-13 MED FILL — IMATINIB MESYLATE 100 MG TA: 100 | 30 days supply | Qty: 60 | Fill #3

## 2020-02-15 ENCOUNTER — Telehealth: Payer: Self-pay | Admitting: Hematology

## 2020-02-15 ENCOUNTER — Other Ambulatory Visit (HOSPITAL_COMMUNITY): Payer: Self-pay | Admitting: Cardiology

## 2020-02-15 NOTE — Telephone Encounter (Signed)
Release: [id number] Faxed medical records to [place] @ fax#[phone]

## 2020-06-30 IMAGING — CT CT BONE MARROW BIOPSY AND ASPIRATION
1 of 2 series · 15 of 28 positions shown, 19 images · non-contrast
Comparison: none

INDICATION: 48-year-old female with progressive leukocytosis. Evaluate for
chronic myelogenous leukemia.

[Series 5: i-spiral 5.0 b40f · axial · 0.90mm/px · z∈[+1025,+1102]mm · 15 of 26 slices shown, 19 images]
[im 2/26  mediastinal]
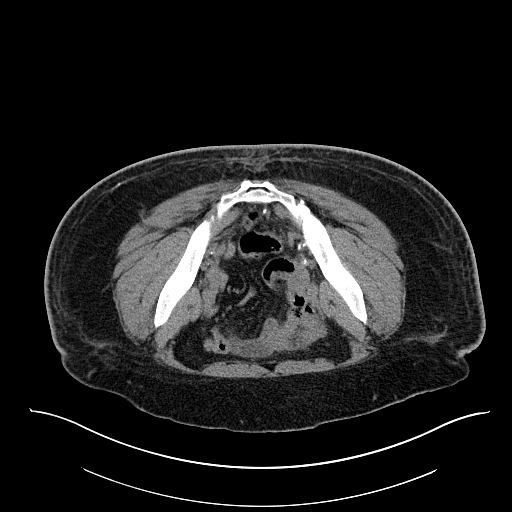
[im 2/26  lung]
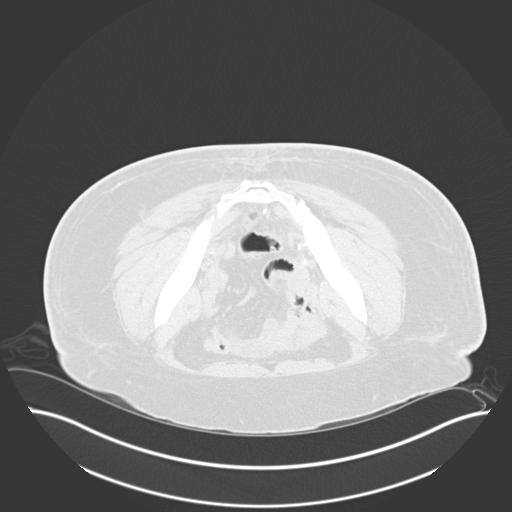
[im 4/26  lung]
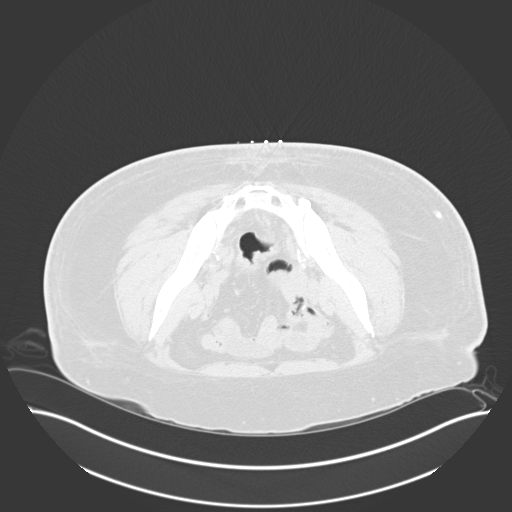
[im 5/26  lung]
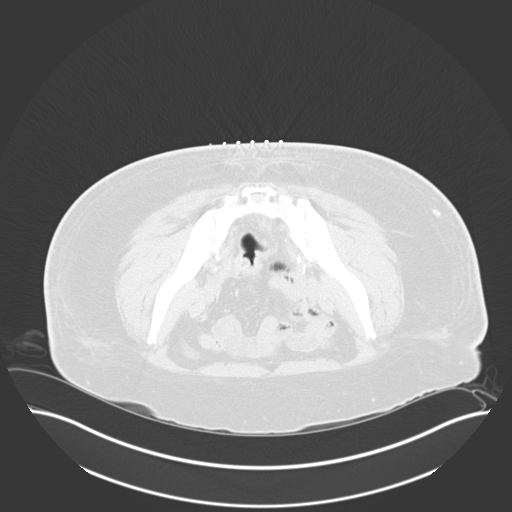
[im 6/26  lung]
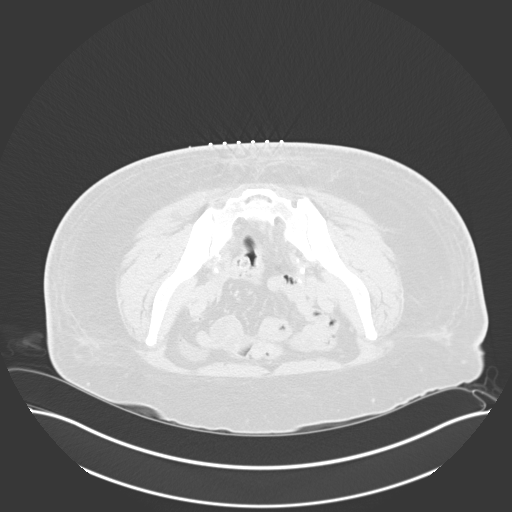
[im 8/26  mediastinal]
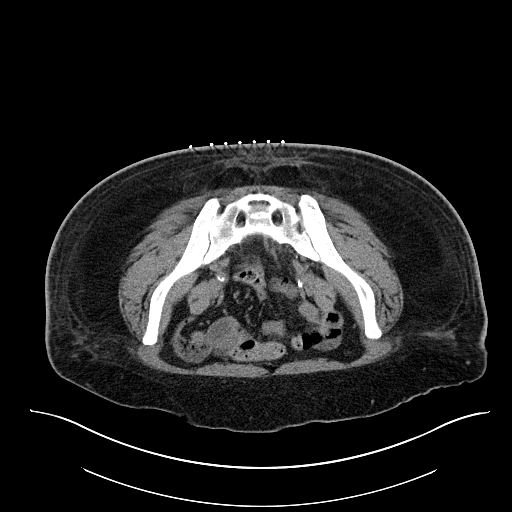
[im 8/26  lung]
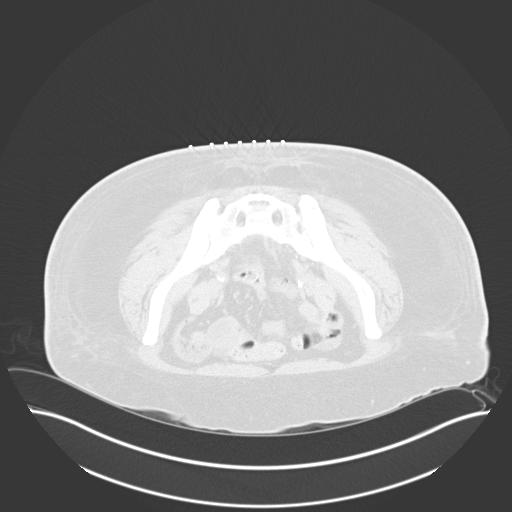
[im 10/26  lung]
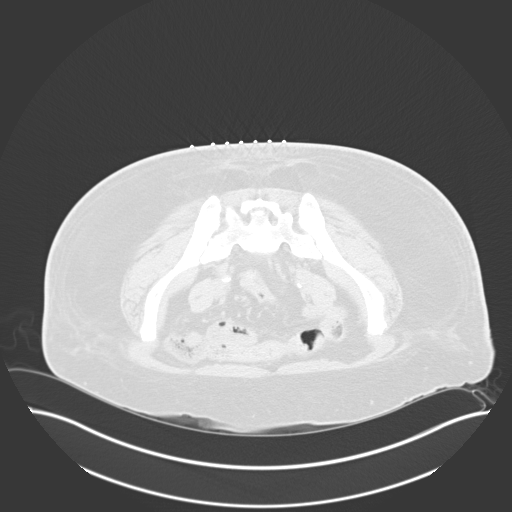
[im 12/26  lung]
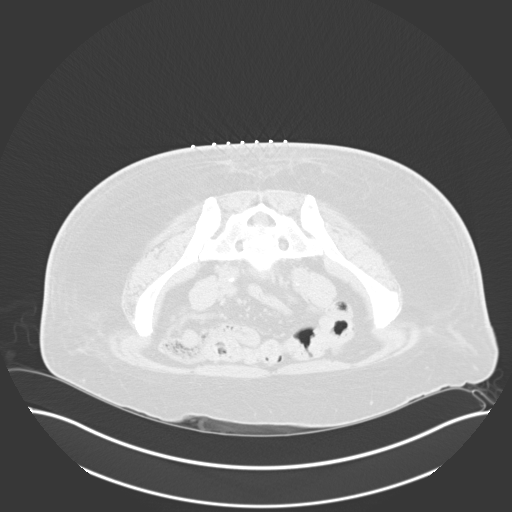
[im 13/26  lung]
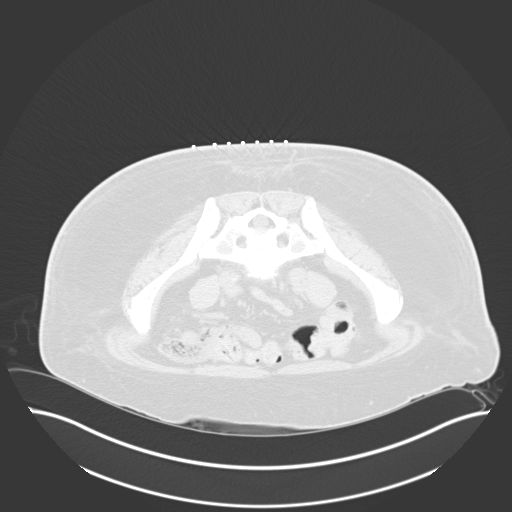
[im 14/26  mediastinal]
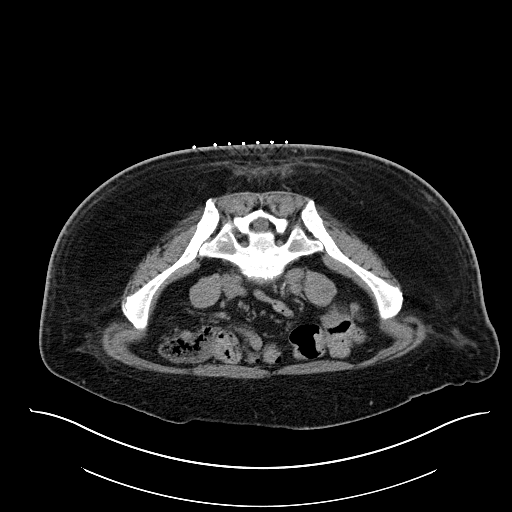
[im 14/26  lung]
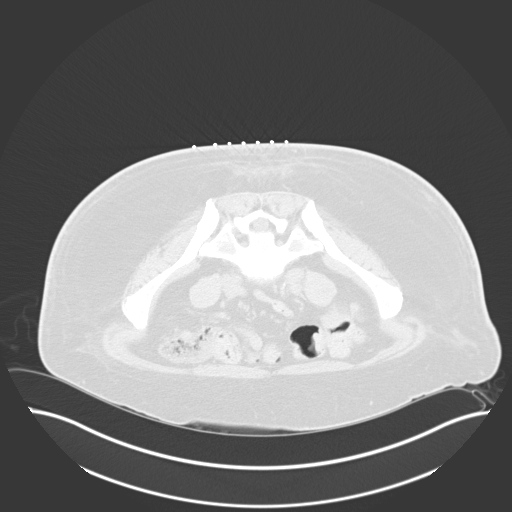
[im 16/26  lung]
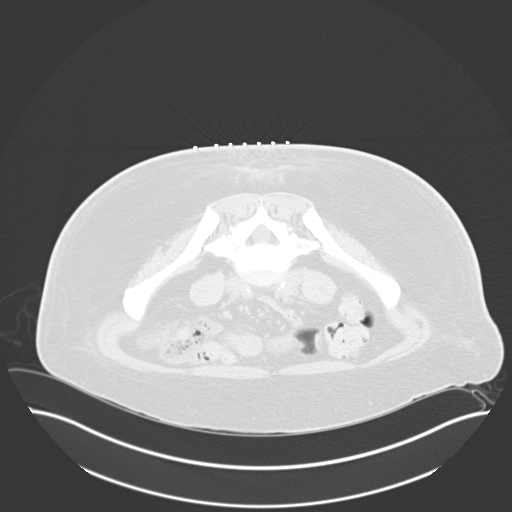
[im 18/26  lung]
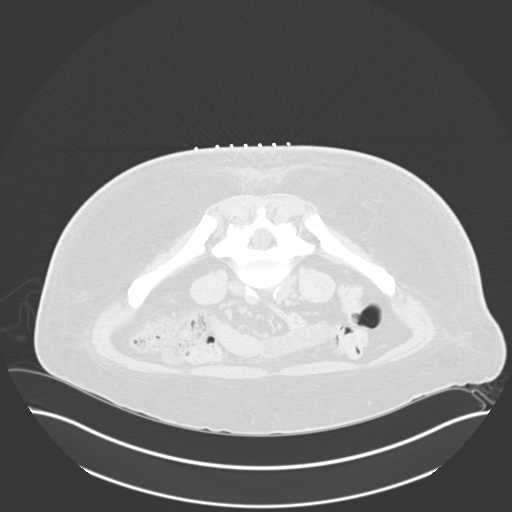
[im 20/26  lung]
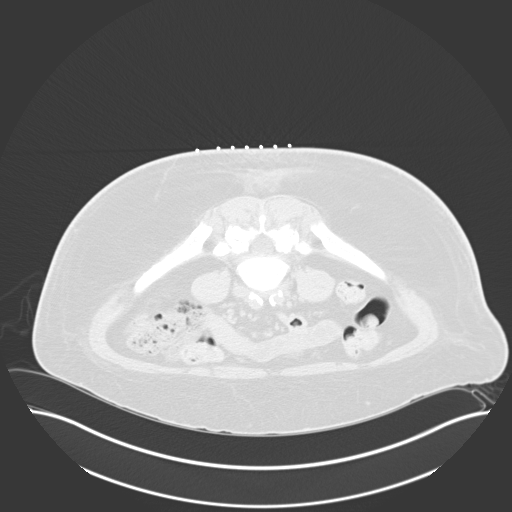
[im 21/26  mediastinal]
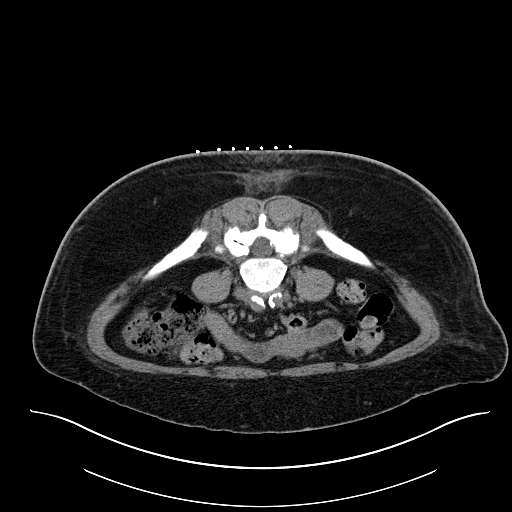
[im 21/26  lung]
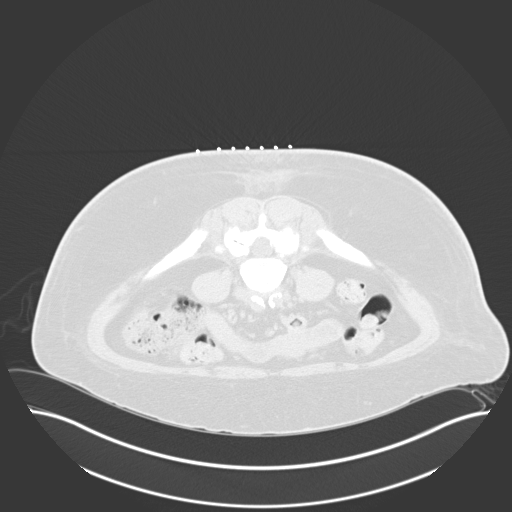
[im 22/26  lung]
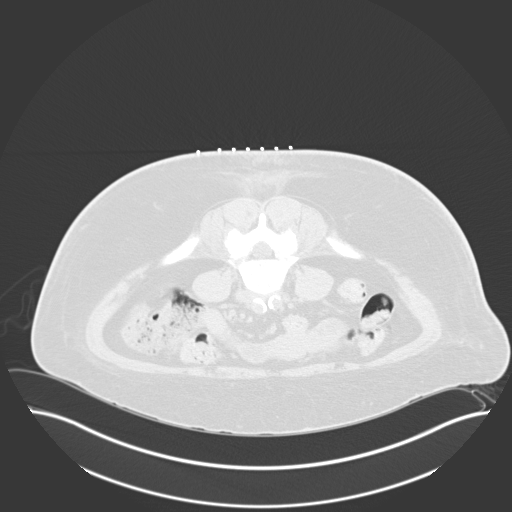
[im 24/26  lung]
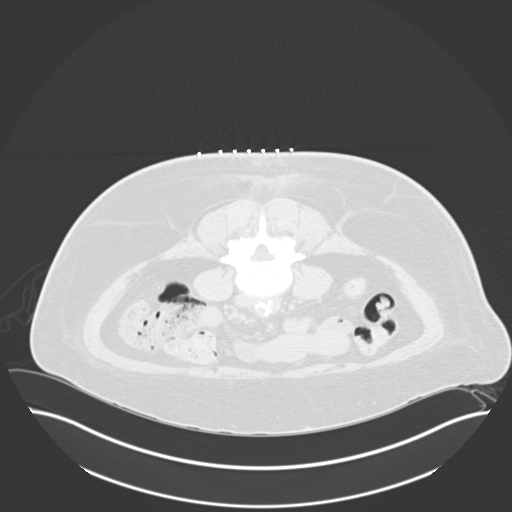

[15 of 28 positions shown; findings below may reference images not displayed]

EXAM:
CT GUIDED BONE MARROW ASPIRATION AND CORE BIOPSY

MEDICATIONS:
None.

ANESTHESIA/SEDATION:
Moderate (conscious) sedation was employed during this procedure. A
total of 2 milligrams versed and 100 micrograms fentanyl were
administered intravenously. The patient's level of consciousness and
vital signs were monitored continuously by radiology nursing
throughout the procedure under my direct supervision.

Total monitored sedation time: 11 minutes

FLUOROSCOPY TIME:  None

COMPLICATIONS:
None immediate.

Estimated blood loss: <25 mL

PROCEDURE:
Informed written consent was obtained from the patient after a
thorough discussion of the procedural risks, benefits and
alternatives. All questions were addressed. Maximal Sterile Barrier
Technique was utilized including caps, mask, sterile gowns, sterile
gloves, sterile drape, hand hygiene and skin antiseptic. A timeout
was performed prior to the initiation of the procedure.

The patient was positioned prone and non-contrast localization CT
was performed of the pelvis to demonstrate the iliac marrow spaces.

Maximal barrier sterile technique utilized including caps, mask,
sterile gowns, sterile gloves, large sterile drape, hand hygiene,
and betadine prep.

Under sterile conditions and local anesthesia, an 11 gauge coaxial
bone biopsy needle was advanced into the right iliac marrow space.
Needle position was confirmed with CT imaging. Initially, bone
marrow aspiration was performed. Next, the 11 gauge outer cannula
was utilized to obtain a right iliac bone marrow core biopsy. Needle
was removed. Hemostasis was obtained with compression. The patient
tolerated the procedure well. Samples were prepared with the
cytotechnologist.
IMPRESSION: Technically successful right-sided iliac bone marrow aspiration and
biopsy.

## 2020-08-05 ENCOUNTER — Other Ambulatory Visit (HOSPITAL_COMMUNITY): Payer: Self-pay

## 2022-05-04 ENCOUNTER — Telehealth: Payer: Self-pay | Admitting: *Deleted

## 2022-05-04 NOTE — Telephone Encounter (Signed)
Transition Care Management Unsuccessful Follow-up Telephone Call  Date of discharge and from where:  05/02/2022  Attempts:  1st Attempt  Reason for unsuccessful TCM follow-up call:  Unable to leave message

## 2022-05-27 LAB — COLOGUARD: COLOGUARD: NEGATIVE
